# Patient Record
Sex: Female | Born: 1985
Health system: Southern US, Community
[De-identification: ages and names within clinical notes are randomized; demographics above are authoritative.]

## PROBLEM LIST (undated history)

## (undated) DIAGNOSIS — I10 Essential (primary) hypertension: Secondary | ICD-10-CM

## (undated) DIAGNOSIS — N62 Hypertrophy of breast: Secondary | ICD-10-CM

---
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
## 2005-08-10 ENCOUNTER — Emergency Department (HOSPITAL_COMMUNITY): Admission: EM | Admit: 2005-08-10 | Discharge: 2005-08-10 | Payer: Self-pay | Admitting: Family Medicine

## 2006-04-11 ENCOUNTER — Emergency Department (HOSPITAL_COMMUNITY): Admission: EM | Admit: 2006-04-11 | Discharge: 2006-04-11 | Payer: Self-pay | Admitting: Family Medicine

## 2006-08-06 ENCOUNTER — Emergency Department (HOSPITAL_COMMUNITY): Admission: EM | Admit: 2006-08-06 | Discharge: 2006-08-06 | Payer: Self-pay | Admitting: Emergency Medicine

## 2007-01-26 ENCOUNTER — Encounter (INDEPENDENT_AMBULATORY_CARE_PROVIDER_SITE_OTHER): Payer: Self-pay | Admitting: *Deleted

## 2007-03-20 ENCOUNTER — Emergency Department (HOSPITAL_COMMUNITY): Admission: EM | Admit: 2007-03-20 | Discharge: 2007-03-20 | Payer: Self-pay | Admitting: Family Medicine

## 2007-09-06 ENCOUNTER — Telehealth: Payer: Self-pay | Admitting: *Deleted

## 2007-09-07 ENCOUNTER — Encounter (INDEPENDENT_AMBULATORY_CARE_PROVIDER_SITE_OTHER): Payer: Self-pay | Admitting: Family Medicine

## 2007-09-07 ENCOUNTER — Ambulatory Visit: Payer: Self-pay | Admitting: Family Medicine

## 2007-09-07 LAB — CONVERTED CEMR LAB
Basophils Relative: 1 % (ref 0–1)
Eosinophils Relative: 2 % (ref 0–5)
Hemoglobin: 14.2 g/dL (ref 12.0–15.0)
Lymphs Abs: 1.6 10*3/uL (ref 0.7–3.3)
MCV: 85.4 fL (ref 78.0–100.0)
Monocytes Absolute: 0.4 10*3/uL (ref 0.2–0.7)
Monocytes Relative: 8 % (ref 3–11)
Neutro Abs: 3.6 10*3/uL (ref 1.7–7.7)
Neutrophils Relative %: 63 % (ref 43–77)
RBC: 5.06 M/uL (ref 3.87–5.11)
Rhuematoid fact SerPl-aCnc: <20 intl units/mL (ref 0–20)

## 2007-09-10 ENCOUNTER — Encounter: Admission: RE | Admit: 2007-09-10 | Discharge: 2007-09-10 | Payer: Self-pay | Admitting: Family Medicine

## 2007-10-08 ENCOUNTER — Ambulatory Visit: Payer: Self-pay | Admitting: Sports Medicine

## 2007-11-07 ENCOUNTER — Ambulatory Visit: Payer: Self-pay | Admitting: Family Medicine

## 2007-11-07 ENCOUNTER — Encounter (INDEPENDENT_AMBULATORY_CARE_PROVIDER_SITE_OTHER): Payer: Self-pay | Admitting: Family Medicine

## 2007-11-07 DIAGNOSIS — E669 Obesity, unspecified: Secondary | ICD-10-CM | POA: Insufficient documentation

## 2007-11-07 LAB — CONVERTED CEMR LAB
HDL: 66 mg/dL
LDL Cholesterol: 71 mg/dL

## 2007-11-20 ENCOUNTER — Encounter (INDEPENDENT_AMBULATORY_CARE_PROVIDER_SITE_OTHER): Payer: Self-pay | Admitting: Family Medicine

## 2008-01-16 ENCOUNTER — Telehealth (INDEPENDENT_AMBULATORY_CARE_PROVIDER_SITE_OTHER): Payer: Self-pay | Admitting: Family Medicine

## 2008-05-08 ENCOUNTER — Telehealth (INDEPENDENT_AMBULATORY_CARE_PROVIDER_SITE_OTHER): Payer: Self-pay | Admitting: Family Medicine

## 2008-12-18 ENCOUNTER — Emergency Department (HOSPITAL_COMMUNITY): Admission: EM | Admit: 2008-12-18 | Discharge: 2008-12-18 | Payer: Self-pay | Admitting: Family Medicine

## 2009-01-14 ENCOUNTER — Ambulatory Visit: Payer: Self-pay | Admitting: Family Medicine

## 2009-01-14 ENCOUNTER — Encounter (INDEPENDENT_AMBULATORY_CARE_PROVIDER_SITE_OTHER): Payer: Self-pay | Admitting: Family Medicine

## 2009-01-14 DIAGNOSIS — K219 Gastro-esophageal reflux disease without esophagitis: Secondary | ICD-10-CM | POA: Insufficient documentation

## 2009-01-14 LAB — CONVERTED CEMR LAB: GC Probe Amp, Genital: NEGATIVE

## 2009-01-19 ENCOUNTER — Encounter (INDEPENDENT_AMBULATORY_CARE_PROVIDER_SITE_OTHER): Payer: Self-pay | Admitting: Family Medicine

## 2009-01-19 ENCOUNTER — Ambulatory Visit (HOSPITAL_COMMUNITY): Admission: RE | Admit: 2009-01-19 | Discharge: 2009-01-19 | Payer: Self-pay | Admitting: Family Medicine

## 2009-01-22 ENCOUNTER — Encounter: Payer: Self-pay | Admitting: Family Medicine

## 2009-01-22 ENCOUNTER — Encounter (INDEPENDENT_AMBULATORY_CARE_PROVIDER_SITE_OTHER): Payer: Self-pay | Admitting: Family Medicine

## 2009-01-22 LAB — CONVERTED CEMR LAB
HCT: 37.7 %
MCV: 85.4 fL
RBC: 4.42 M/uL
RDW: 12.9 %
WBC: 7 10*3/uL

## 2009-08-06 ENCOUNTER — Ambulatory Visit (HOSPITAL_COMMUNITY): Admission: RE | Admit: 2009-08-06 | Discharge: 2009-08-06 | Payer: Self-pay | Admitting: Obstetrics and Gynecology

## 2009-09-01 ENCOUNTER — Inpatient Hospital Stay (HOSPITAL_COMMUNITY): Admission: AD | Admit: 2009-09-01 | Discharge: 2009-09-04 | Payer: Self-pay | Admitting: Family Medicine

## 2009-12-30 ENCOUNTER — Emergency Department (HOSPITAL_COMMUNITY): Admission: EM | Admit: 2009-12-30 | Discharge: 2009-12-30 | Payer: Self-pay | Admitting: Emergency Medicine

## 2010-09-21 LAB — CONVERTED CEMR LAB
HCT: 38.8 %
MCV: 84.2 fL
Total CHOL/HDL Ratio: 3.2

## 2010-10-08 ENCOUNTER — Ambulatory Visit: Payer: Self-pay | Admitting: Family Medicine

## 2010-10-08 ENCOUNTER — Encounter: Payer: Self-pay | Admitting: Family Medicine

## 2010-10-08 DIAGNOSIS — I10 Essential (primary) hypertension: Secondary | ICD-10-CM | POA: Insufficient documentation

## 2010-10-08 LAB — CONVERTED CEMR LAB
ALT: 12 units/L
Albumin: 4.3 g/dL
Alkaline Phosphatase: 58 units/L
CO2: 23 meq/L
Calcium: 9.3 mg/dL
Chloride: 104 meq/L
Creatinine, Ser: 0.6 mg/dL
Sodium: 139 meq/L

## 2010-10-15 ENCOUNTER — Encounter: Payer: Self-pay | Admitting: Family Medicine

## 2010-10-20 ENCOUNTER — Encounter: Payer: Self-pay | Admitting: Family Medicine

## 2010-10-22 LAB — CONVERTED CEMR LAB
HCT: 38.8 %
MCHC: 33 g/dL
Platelets: 275 10*3/uL
RDW: 14 %

## 2010-12-30 NOTE — Miscellaneous (Signed)
Summary: ROI  ROI   Imported By: De Nurse 10/15/2010 16:59:57  _____________________________________________________________________  External Attachment:    Type:   Image     Comment:   External Document

## 2010-12-30 NOTE — Assessment & Plan Note (Signed)
Summary: cpe/   Vital Signs:  Patient profile:   25 year old female Height:      64 inches Weight:      229.3 pounds BMI:     39.50 Pulse rate:   92 / minute BP sitting:   138 / 88  (right arm)  Vitals Entered By: Arlyss Repress CMA, (October 08, 2010 2:54 PM) CC: physical. discuss HTN and weight management. f/up labs. Is Patient Diabetic? No Pain Assessment Patient in pain? no        Primary Care Yogesh Cominsky:  Milinda Antis MD  CC:  physical. discuss HTN and weight management. f/up labs..  History of Present Illness:   Hypertension-- Blood pressure at work has been normal range taking less than 120/80, no chest pain,no HA , GYN has been managing BP will get records, taking Labetalol . States given diagnosis in first trimester  Weight loss--  Heaviest weight 240lbs, would like to loose weight , eats 3 times a day, no snacks 24hours recall- Breakfast Sausage/ Grits/ Cofee (sugar cream),   Lunch- Spaghetti meat sauce, gingerale,     Dinner- [employer] chicken, fries, honey mustard , Sweat Tea , drink 2 bottles (16oz) of water , Mother in law cooks for family   No exercise  Goal to is get under 200lbs --- Understands she needs to exercise, needs motivation, husband lacks motivation, lacks finances     Labs--  reviewed labs from [facility]   States had PAP smear this year at GYN  No birth control currently  Habits & Providers  Alcohol-Tobacco-Diet     Tobacco Status: never  Current Medications (verified): 1)  Ranitidine Hcl 150 Mg Caps (Ranitidine Hcl) .Marland Kitchen.. 1 By Mouth Two Times A Day 2)  Labetalol Hcl 100 Mg Tabs (Labetalol Hcl) .Marland Kitchen.. 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  Lives with Mom, daugher, and stepdad.  Denies tobacco and illicit drugs.  Occasional social drink. Married,  Sexually active, monogamous.  Physical Exam  General:  NAD, well developed, obese Vital signs noted Repeat BP 128/80 Eyes:  vision grossly intact, pupils equal, pupils round, pupils reactive to light,  Lungs:  Normal respiratory effort, chest expands symmetrically. Lungs are clear to auscultation, no crackles or wheezes. Heart:  RRR, no murmur Pulses:  Radial DP 2+ Extremities:  No edema   Impression & Recommendations:  Problem # 1:  HYPERTENSION, BENIGN (ICD-401.1) Assessment New   Newly diagnosed, as given diagnosis in first trimester likley essential HTN, currently not breastfeeding, will obtain records from GYN, pt can likley be switched to HCTZ, though BP well controlled  Her updated medication list for this problem includes:    Labetalol Hcl 100 Mg Tabs (Labetalol hcl) .Marland Kitchen... 1 by mouth two times a day  Orders: [facility]- Est  Level 4 (04540)  Problem # 2:  OBESITY (ICD-278.00) Assessment: Unchanged   s/p NSVD 1 year ago. Noted pt does not eat veggies or fruits routinely and has no exercise program. Pt wants to loose weight but lacks motivation.  See instructions below  Orders: [facility]- Est  Level 4 (98119) Discussed labs with patient Obtain records to verify last PAP  Smear  Complete Medication List: 1)  Ranitidine Hcl 150 Mg Caps (Ranitidine hcl) .Marland Kitchen.. 1 by mouth two times a day 2)  Labetalol Hcl 100 Mg Tabs (Labetalol hcl) .Marland Kitchen.. 1 by mouth two times a day  Patient Instructions: 1)  Consider joining a gym, or walking at home- 2 days a week 30 minutes 2)  Keep a calender on the fridge and check off the days 3)  Meals- try to add 1 veggie with lunch and dinner, get some fruit in 4)  Try to eat 1 snack-fruit ,granola bar 5)  Danie Chandler, nutritionist- schedule an appt if you would like 6)  Next visit in 2 months  7)  I will  records from your ob/gyn  8)  No change to your blood pressure medications    Orders Added: 1)  [facility]- Est  Level 4 [54098]   Immunization History:  Influenza Immunization History:    Influenza:  historical (09/21/2010)   Immunization History:  Influenza Immunization History:    Influenza:  Historical (09/21/2010)

## 2010-12-30 NOTE — Miscellaneous (Signed)
Summary: labs  Clinical Lists Changes  Observations: Added new observation of HIV AB: neg (01/22/2009 11:33) Added new observation of HBSAG: negatvie (01/22/2009 11:33) Added new observation of PLATELETK/UL: 232 K/uL (01/22/2009 11:33) Added new observation of RDW: 12.9 % (01/22/2009 11:33) Added new observation of MCHC RBC: 33.6 g/dL (16/08/9603 54:09) Added new observation of MCV: 85.4 fL (01/22/2009 11:33) Added new observation of HCT: 37.7 % (01/22/2009 11:33) Added new observation of HGB: 12.7 g/dL (81/19/1478 29:56) Added new observation of RBC M/UL: 4.42 M/uL (01/22/2009 11:33) Added new observation of WBC COUNT: 7.0 10*3/microliter (01/22/2009 11:33)

## 2011-01-12 ENCOUNTER — Inpatient Hospital Stay (INDEPENDENT_AMBULATORY_CARE_PROVIDER_SITE_OTHER)
Admission: RE | Admit: 2011-01-12 | Discharge: 2011-01-12 | Disposition: A | Discharge status: Home / Self Care | Payer: Commercial Managed Care - PPO | Source: Ambulatory Visit | Attending: Emergency Medicine | Admitting: Emergency Medicine

## 2011-01-12 DIAGNOSIS — K5289 Other specified noninfective gastroenteritis and colitis: Secondary | ICD-10-CM

## 2011-01-12 LAB — POCT I-STAT, CHEM 8
BUN: 7 mg/dL (ref 6–23)
Calcium, Ion: 1.1 mmol/L — ABNORMAL LOW (ref 1.12–1.32)
Chloride: 106 mEq/L (ref 96–112)
Creatinine, Ser: 0.8 mg/dL (ref 0.4–1.2)
Glucose, Bld: 90 mg/dL (ref 70–99)

## 2011-01-12 LAB — DIFFERENTIAL
Basophils Absolute: 0.1 10*3/uL (ref 0.0–0.1)
Basophils Relative: 1 % (ref 0–1)
Eosinophils Absolute: 0.1 10*3/uL (ref 0.0–0.7)
Lymphocytes Relative: 30 % (ref 12–46)
Lymphs Abs: 1.6 10*3/uL (ref 0.7–4.0)
Monocytes Absolute: 0.5 10*3/uL (ref 0.1–1.0)
Neutro Abs: 3.3 10*3/uL (ref 1.7–7.7)

## 2011-01-12 LAB — POCT URINALYSIS DIPSTICK
Hgb urine dipstick: NEGATIVE
Nitrite: NEGATIVE
Specific Gravity, Urine: 1.025 (ref 1.005–1.030)

## 2011-01-12 LAB — CBC
HCT: 38.2 % (ref 36.0–46.0)
Hemoglobin: 13 g/dL (ref 12.0–15.0)
MCV: 83 fL (ref 78.0–100.0)

## 2011-02-14 ENCOUNTER — Emergency Department (HOSPITAL_COMMUNITY): Payer: No Typology Code available for payment source

## 2011-02-14 ENCOUNTER — Emergency Department (HOSPITAL_COMMUNITY)
Admission: EM | Admit: 2011-02-14 | Discharge: 2011-02-14 | Disposition: A | Discharge status: Home / Self Care | Payer: No Typology Code available for payment source | Attending: General Surgery | Admitting: General Surgery

## 2011-02-14 DIAGNOSIS — IMO0002 Reserved for concepts with insufficient information to code with codable children: Secondary | ICD-10-CM | POA: Insufficient documentation

## 2011-02-14 DIAGNOSIS — S139XXA Sprain of joints and ligaments of unspecified parts of neck, initial encounter: Secondary | ICD-10-CM | POA: Insufficient documentation

## 2011-02-14 DIAGNOSIS — M542 Cervicalgia: Secondary | ICD-10-CM | POA: Insufficient documentation

## 2011-02-14 DIAGNOSIS — I10 Essential (primary) hypertension: Secondary | ICD-10-CM | POA: Insufficient documentation

## 2011-02-14 DIAGNOSIS — S060X0A Concussion without loss of consciousness, initial encounter: Secondary | ICD-10-CM | POA: Insufficient documentation

## 2011-02-14 DIAGNOSIS — R51 Headache: Secondary | ICD-10-CM | POA: Insufficient documentation

## 2011-02-17 ENCOUNTER — Ambulatory Visit
Admission: RE | Admit: 2011-02-17 | Discharge: 2011-02-17 | Disposition: A | Discharge status: Home / Self Care | Payer: Self-pay | Source: Ambulatory Visit | Attending: Family Medicine | Admitting: Family Medicine

## 2011-02-17 ENCOUNTER — Ambulatory Visit (INDEPENDENT_AMBULATORY_CARE_PROVIDER_SITE_OTHER): Payer: Commercial Managed Care - PPO | Admitting: Family Medicine

## 2011-02-17 ENCOUNTER — Encounter: Payer: Self-pay | Admitting: Family Medicine

## 2011-02-17 ENCOUNTER — Telehealth: Payer: Self-pay | Admitting: Family Medicine

## 2011-02-17 ENCOUNTER — Other Ambulatory Visit: Payer: Self-pay | Admitting: Family Medicine

## 2011-02-17 VITALS — BP 128/96 | HR 82 | Temp 98.4°F | Ht 65.0 in | Wt 235.0 lb

## 2011-02-17 DIAGNOSIS — R109 Unspecified abdominal pain: Secondary | ICD-10-CM

## 2011-02-17 DIAGNOSIS — S139XXA Sprain of joints and ligaments of unspecified parts of neck, initial encounter: Secondary | ICD-10-CM

## 2011-02-17 DIAGNOSIS — S39012A Strain of muscle, fascia and tendon of lower back, initial encounter: Secondary | ICD-10-CM | POA: Insufficient documentation

## 2011-02-17 DIAGNOSIS — S060XAA Concussion with loss of consciousness status unknown, initial encounter: Secondary | ICD-10-CM | POA: Insufficient documentation

## 2011-02-17 DIAGNOSIS — IMO0002 Reserved for concepts with insufficient information to code with codable children: Secondary | ICD-10-CM

## 2011-02-17 DIAGNOSIS — S335XXA Sprain of ligaments of lumbar spine, initial encounter: Secondary | ICD-10-CM

## 2011-02-17 DIAGNOSIS — S161XXA Strain of muscle, fascia and tendon at neck level, initial encounter: Secondary | ICD-10-CM | POA: Insufficient documentation

## 2011-02-17 DIAGNOSIS — S060X9A Concussion with loss of consciousness of unspecified duration, initial encounter: Secondary | ICD-10-CM

## 2011-02-17 MED ORDER — CYCLOBENZAPRINE HCL 10 MG PO TABS: 10.0000 mg | ORAL_TABLET | Freq: Three times a day (TID) | ORAL | Status: DC | PRN

## 2011-02-17 MED ORDER — OXYCODONE-ACETAMINOPHEN 5-325 MG PO TABS: ORAL_TABLET | ORAL | Status: DC

## 2011-02-17 NOTE — Telephone Encounter (Signed)
Completed will put in front desk
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
## 2011-02-17 NOTE — Patient Instructions (Signed)
Start the flexeril Use the Percocet as needed for pain, if you have difficulty with this medicine please let me know You should remain out of work until seen by me next week  If you have worsening of Headache, vomiting, severe pain change in your vision- then go to the ER Get the x-ray of your chest

## 2011-02-17 NOTE — Assessment & Plan Note (Addendum)
With bruising and pain noted on chest wall, will obtain rib series

## 2011-02-17 NOTE — Telephone Encounter (Signed)
FMLA form dropped off to be filled out.  Please call patient when completed. Placed in dr's box.

## 2011-02-17 NOTE — Assessment & Plan Note (Signed)
peristant mild concussive symptoms- Out of work, monitor Regarding blurred disc , pt was re-examined with attending physican- slight blurring but feel this may be a variant as the optic cup appears normal and the vasculature. If continued symptoms plan to rescan with CT Head
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
## 2011-02-17 NOTE — Assessment & Plan Note (Signed)
No red flags, treatment per above

## 2011-02-18 ENCOUNTER — Telehealth: Payer: Self-pay | Admitting: Family Medicine

## 2011-02-18 NOTE — Telephone Encounter (Signed)
Message given to patient about neg rib series She is doing okay, understands to call to speak to on call MD if she has difficulty with medication or go to ER for worsening symptoms

## 2011-02-28 ENCOUNTER — Ambulatory Visit
Admission: RE | Admit: 2011-02-28 | Discharge: 2011-02-28 | Disposition: A | Discharge status: Home / Self Care | Payer: Commercial Managed Care - PPO | Source: Ambulatory Visit | Attending: Family Medicine | Admitting: Family Medicine

## 2011-02-28 ENCOUNTER — Ambulatory Visit (INDEPENDENT_AMBULATORY_CARE_PROVIDER_SITE_OTHER): Payer: Commercial Managed Care - PPO | Admitting: Family Medicine

## 2011-02-28 ENCOUNTER — Other Ambulatory Visit: Payer: Self-pay | Admitting: Family Medicine

## 2011-02-28 VITALS — BP 120/79 | HR 75 | Temp 98.0°F | Ht 64.5 in | Wt 238.4 lb

## 2011-02-28 DIAGNOSIS — M549 Dorsalgia, unspecified: Secondary | ICD-10-CM

## 2011-02-28 DIAGNOSIS — M545 Low back pain, unspecified: Secondary | ICD-10-CM | POA: Insufficient documentation

## 2011-02-28 DIAGNOSIS — S139XXA Sprain of joints and ligaments of unspecified parts of neck, initial encounter: Secondary | ICD-10-CM

## 2011-02-28 DIAGNOSIS — S161XXA Strain of muscle, fascia and tendon at neck level, initial encounter: Secondary | ICD-10-CM

## 2011-02-28 DIAGNOSIS — IMO0002 Reserved for concepts with insufficient information to code with codable children: Secondary | ICD-10-CM

## 2011-02-28 DIAGNOSIS — G8929 Other chronic pain: Secondary | ICD-10-CM | POA: Insufficient documentation

## 2011-02-28 MED ORDER — OXYCODONE-ACETAMINOPHEN 5-325 MG PO TABS: ORAL_TABLET | ORAL | Status: DC

## 2011-02-28 NOTE — Assessment & Plan Note (Signed)
Diagnosed with lumbar strain, now with persistent pain with dominant side. Will obtain plain films of back, this may still be musculoskeletal Pt to start PT  No red flags

## 2011-02-28 NOTE — Patient Instructions (Signed)
Follow-up with Dr. Margaretha Sheffield on Alexis Foley We will get the x-rays of your back  Continue with the plan for PT Use the pain medication as needed
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
## 2011-02-28 NOTE — Assessment & Plan Note (Signed)
Neck ROM has improved a vast amount compared to initial exam, she still has some spasm in that left trapezius. Her neurological exam overall is reassuring, no gross weakness found on the left shoulder, will plan for her to start PT as set up by the orthopedic surgeon Hold on MRI as she is progressing in the right direction. Continue pain medications and muscle relaxant Out of work at least until the end of the week

## 2011-03-01 ENCOUNTER — Telehealth: Payer: Self-pay | Admitting: Family Medicine

## 2011-03-01 NOTE — Telephone Encounter (Signed)
X-ray of back normal, no evidence of herniation, disc buldge, arthritic changes, fracture Pt informed

## 2011-03-03 LAB — CBC
HCT: 29.6 % — ABNORMAL LOW (ref 36.0–46.0)
Hemoglobin: 12.8 g/dL (ref 12.0–15.0)
MCV: 90 fL (ref 78.0–100.0)
RBC: 3.29 MIL/uL — ABNORMAL LOW (ref 3.87–5.11)
RBC: 4.23 MIL/uL (ref 3.87–5.11)
WBC: 10.1 10*3/uL (ref 4.0–10.5)

## 2011-03-03 LAB — RPR: RPR Ser Ql: NONREACTIVE

## 2011-03-07 ENCOUNTER — Telehealth: Payer: Self-pay | Admitting: Family Medicine

## 2011-03-07 NOTE — Telephone Encounter (Signed)
Pt now back at work F/U with Charlane Ferretti- Dr. Margaretha Sheffield in 1 month PT starts tomorrow I informed her I have compelted her short term disability form

## 2011-03-08 ENCOUNTER — Ambulatory Visit: Payer: 59 | Attending: Sports Medicine | Admitting: Physical Therapy

## 2011-03-08 DIAGNOSIS — R293 Abnormal posture: Secondary | ICD-10-CM | POA: Insufficient documentation

## 2011-03-08 DIAGNOSIS — IMO0001 Reserved for inherently not codable concepts without codable children: Secondary | ICD-10-CM | POA: Insufficient documentation

## 2011-03-08 DIAGNOSIS — M2560 Stiffness of unspecified joint, not elsewhere classified: Secondary | ICD-10-CM | POA: Insufficient documentation

## 2011-03-08 DIAGNOSIS — M2550 Pain in unspecified joint: Secondary | ICD-10-CM | POA: Insufficient documentation

## 2011-03-14 LAB — POCT URINALYSIS DIP (DEVICE)
Bilirubin Urine: NEGATIVE
Ketones, ur: 40 mg/dL — AB
Protein, ur: NEGATIVE mg/dL
Specific Gravity, Urine: 1.02 (ref 1.005–1.030)
pH: 7 (ref 5.0–8.0)

## 2011-03-14 LAB — POCT PREGNANCY, URINE: Preg Test, Ur: POSITIVE

## 2011-03-16 ENCOUNTER — Ambulatory Visit: Payer: 59 | Admitting: Physical Therapy

## 2011-03-17 ENCOUNTER — Ambulatory Visit: Payer: 59 | Admitting: Physical Therapy

## 2011-03-23 ENCOUNTER — Ambulatory Visit: Payer: 59 | Admitting: Physical Therapy

## 2011-03-25 ENCOUNTER — Ambulatory Visit: Payer: 59 | Admitting: Physical Therapy

## 2011-03-29 ENCOUNTER — Ambulatory Visit: Payer: 59 | Attending: Sports Medicine | Admitting: Physical Therapy

## 2011-03-29 ENCOUNTER — Encounter: Payer: Commercial Managed Care - PPO | Admitting: Physical Therapy

## 2011-03-29 DIAGNOSIS — M2560 Stiffness of unspecified joint, not elsewhere classified: Secondary | ICD-10-CM | POA: Insufficient documentation

## 2011-03-29 DIAGNOSIS — IMO0001 Reserved for inherently not codable concepts without codable children: Secondary | ICD-10-CM | POA: Insufficient documentation

## 2011-03-29 DIAGNOSIS — M2550 Pain in unspecified joint: Secondary | ICD-10-CM | POA: Insufficient documentation

## 2011-03-29 DIAGNOSIS — R293 Abnormal posture: Secondary | ICD-10-CM | POA: Insufficient documentation

## 2011-03-31 ENCOUNTER — Ambulatory Visit: Payer: 59 | Admitting: Physical Therapy

## 2011-04-06 ENCOUNTER — Other Ambulatory Visit: Payer: Self-pay | Admitting: Family Medicine

## 2011-04-06 ENCOUNTER — Ambulatory Visit: Payer: 59 | Admitting: Physical Therapy

## 2011-04-06 MED ORDER — LABETALOL HCL 100 MG PO TABS: 100.0000 mg | ORAL_TABLET | Freq: Two times a day (BID) | ORAL | Status: AC

## 2011-04-06 NOTE — Telephone Encounter (Signed)
Ran out of labetalol (NORMODYNE) 100 MG tablet which was prescribed by another doctor - wants to know if Dr Jeanice Lim will continue this refill- [facility]

## 2011-04-06 NOTE — Telephone Encounter (Signed)
Pt has diagnosis of HTN, will fill her Labetalol

## 2011-05-31 ENCOUNTER — Ambulatory Visit: Payer: Commercial Managed Care - PPO | Admitting: Family Medicine

## 2011-06-25 ENCOUNTER — Encounter: Payer: Self-pay | Admitting: Family Medicine

## 2011-12-30 DIAGNOSIS — N62 Hypertrophy of breast: Secondary | ICD-10-CM

## 2011-12-30 HISTORY — DX: Hypertrophy of breast: N62

## 2012-01-10 ENCOUNTER — Encounter (HOSPITAL_BASED_OUTPATIENT_CLINIC_OR_DEPARTMENT_OTHER): Payer: Self-pay | Admitting: *Deleted

## 2012-01-10 NOTE — Pre-Procedure Instructions (Signed)
To come for BMET; last EKG 01/2011

## 2012-01-16 ENCOUNTER — Encounter (HOSPITAL_BASED_OUTPATIENT_CLINIC_OR_DEPARTMENT_OTHER)
Admission: RE | Admit: 2012-01-16 | Discharge: 2012-01-16 | Disposition: A | Discharge status: Home / Self Care | Payer: 59 | Source: Ambulatory Visit | Attending: Plastic Surgery | Admitting: Plastic Surgery

## 2012-01-16 LAB — BASIC METABOLIC PANEL
CO2: 23 mEq/L (ref 19–32)
Calcium: 10 mg/dL (ref 8.4–10.5)
Chloride: 104 mEq/L (ref 96–112)
Glucose, Bld: 91 mg/dL (ref 70–99)
Sodium: 137 mEq/L (ref 135–145)

## 2012-01-17 ENCOUNTER — Encounter (HOSPITAL_BASED_OUTPATIENT_CLINIC_OR_DEPARTMENT_OTHER): Payer: Self-pay | Admitting: Certified Registered Nurse Anesthetist

## 2012-01-17 ENCOUNTER — Encounter (HOSPITAL_BASED_OUTPATIENT_CLINIC_OR_DEPARTMENT_OTHER): Payer: Self-pay | Admitting: *Deleted

## 2012-01-17 ENCOUNTER — Encounter (HOSPITAL_BASED_OUTPATIENT_CLINIC_OR_DEPARTMENT_OTHER)
Admission: RE | Disposition: A | Discharge status: Home / Self Care | Payer: Self-pay | Source: Ambulatory Visit | Attending: Plastic Surgery

## 2012-01-17 ENCOUNTER — Other Ambulatory Visit: Payer: Self-pay | Admitting: Plastic Surgery

## 2012-01-17 ENCOUNTER — Ambulatory Visit (HOSPITAL_BASED_OUTPATIENT_CLINIC_OR_DEPARTMENT_OTHER)
Admission: RE | Admit: 2012-01-17 | Discharge: 2012-01-17 | Disposition: A | Discharge status: Home / Self Care | Payer: 59 | Source: Ambulatory Visit | Attending: Plastic Surgery | Admitting: Plastic Surgery

## 2012-01-17 ENCOUNTER — Ambulatory Visit (HOSPITAL_BASED_OUTPATIENT_CLINIC_OR_DEPARTMENT_OTHER): Payer: 59 | Admitting: Certified Registered Nurse Anesthetist

## 2012-01-17 DIAGNOSIS — I10 Essential (primary) hypertension: Secondary | ICD-10-CM | POA: Insufficient documentation

## 2012-01-17 DIAGNOSIS — Z01812 Encounter for preprocedural laboratory examination: Secondary | ICD-10-CM | POA: Insufficient documentation

## 2012-01-17 DIAGNOSIS — K219 Gastro-esophageal reflux disease without esophagitis: Secondary | ICD-10-CM | POA: Insufficient documentation

## 2012-01-17 DIAGNOSIS — N62 Hypertrophy of breast: Secondary | ICD-10-CM | POA: Insufficient documentation

## 2012-01-17 HISTORY — PX: BREAST REDUCTION SURGERY: SHX8

## 2012-01-17 HISTORY — DX: Essential (primary) hypertension: I10

## 2012-01-17 HISTORY — DX: Hypertrophy of breast: N62

## 2012-01-17 SURGERY — MAMMOPLASTY, REDUCTION
Anesthesia: General | Site: Breast | Laterality: Bilateral | Wound class: Clean

## 2012-01-17 MED ORDER — PROPOFOL 10 MG/ML IV EMUL
INTRAVENOUS | Status: DC | PRN
  Administered 2012-01-17: 200 mg via INTRAVENOUS

## 2012-01-17 MED ORDER — BUPIVACAINE-EPINEPHRINE 0.5% -1:200000 IJ SOLN
INTRAMUSCULAR | Status: DC | PRN
  Administered 2012-01-17: 40 mL

## 2012-01-17 MED ORDER — MIDAZOLAM HCL 5 MG/5ML IJ SOLN
INTRAMUSCULAR | Status: DC | PRN
  Administered 2012-01-17: 2 mg via INTRAVENOUS

## 2012-01-17 MED ORDER — DROPERIDOL 2.5 MG/ML IJ SOLN: 0.6250 mg | INTRAMUSCULAR | Status: DC | PRN

## 2012-01-17 MED ORDER — CEFAZOLIN SODIUM 1-5 GM-% IV SOLN: 1.0000 g | Freq: Once | INTRAVENOUS | Status: DC

## 2012-01-17 MED ORDER — OXYCODONE-ACETAMINOPHEN 5-325 MG PO TABS: 1.0000 | ORAL_TABLET | ORAL | Status: DC | PRN

## 2012-01-17 MED ORDER — HYDROMORPHONE HCL PF 1 MG/ML IJ SOLN: 0.2500 mg | INTRAMUSCULAR | Status: DC | PRN

## 2012-01-17 MED ORDER — LIDOCAINE-EPINEPHRINE 1 %-1:100000 IJ SOLN
INTRAMUSCULAR | Status: DC | PRN
  Administered 2012-01-17: 40 mL

## 2012-01-17 MED ORDER — FENTANYL CITRATE 0.05 MG/ML IJ SOLN
INTRAMUSCULAR | Status: DC | PRN
  Administered 2012-01-17: 100 ug via INTRAVENOUS
  Administered 2012-01-17 (×3): 50 ug via INTRAVENOUS
  Administered 2012-01-17 (×2): 100 ug via INTRAVENOUS

## 2012-01-17 MED ORDER — SUCCINYLCHOLINE CHLORIDE 20 MG/ML IJ SOLN
INTRAMUSCULAR | Status: DC | PRN
  Administered 2012-01-17: 100 mg via INTRAVENOUS

## 2012-01-17 MED ORDER — CEFAZOLIN SODIUM 1-5 GM-% IV SOLN
INTRAVENOUS | Status: DC | PRN
  Administered 2012-01-17: 2 g via INTRAVENOUS

## 2012-01-17 MED ORDER — BACITRACIN ZINC 500 UNIT/GM EX OINT
TOPICAL_OINTMENT | CUTANEOUS | Status: DC | PRN
  Administered 2012-01-17: 1 via TOPICAL

## 2012-01-17 MED ORDER — LACTATED RINGERS IV SOLN
INTRAVENOUS | Status: DC
  Administered 2012-01-17 (×3): via INTRAVENOUS

## 2012-01-17 MED ORDER — DEXAMETHASONE SODIUM PHOSPHATE 4 MG/ML IJ SOLN
INTRAMUSCULAR | Status: DC | PRN
  Administered 2012-01-17: 10 mg via INTRAVENOUS

## 2012-01-17 MED ORDER — ONDANSETRON HCL 4 MG/2ML IJ SOLN
INTRAMUSCULAR | Status: DC | PRN
  Administered 2012-01-17: 4 mg via INTRAVENOUS

## 2012-01-17 SURGICAL SUPPLY — 57 items
BANDAGE GAUZE ELAST BULKY 4 IN (GAUZE/BANDAGES/DRESSINGS) ×4 IMPLANT
BENZOIN TINCTURE PRP APPL 2/3 (GAUZE/BANDAGES/DRESSINGS) ×2 IMPLANT
BLADE KNIFE PERSONA 10 (BLADE) ×8 IMPLANT
BLADE KNIFE PERSONA 15 (BLADE) ×6 IMPLANT
CANISTER SUCTION 1200CC (MISCELLANEOUS) ×2 IMPLANT
CAP BOUFFANT 24 BLUE NURSES (PROTECTIVE WEAR) ×2 IMPLANT
CLOTH BEACON ORANGE TIMEOUT ST (SAFETY) ×2 IMPLANT
COVER MAYO STAND STRL (DRAPES) ×2 IMPLANT
COVER TABLE BACK 60X90 (DRAPES) ×2 IMPLANT
DECANTER SPIKE VIAL GLASS SM (MISCELLANEOUS) ×4 IMPLANT
DRAIN CHANNEL 10F 3/8 F FF (DRAIN) ×4 IMPLANT
DRAPE LAPAROSCOPIC ABDOMINAL (DRAPES) ×2 IMPLANT
DRSG EMULSION OIL 3X3 NADH (GAUZE/BANDAGES/DRESSINGS) ×2 IMPLANT
DRSG PAD ABDOMINAL 8X10 ST (GAUZE/BANDAGES/DRESSINGS) ×2 IMPLANT
ELECT NEEDLE TIP 2.8 STRL (NEEDLE) IMPLANT
ELECT REM PT RETURN 9FT ADLT (ELECTROSURGICAL) ×2
ELECTRODE REM PT RTRN 9FT ADLT (ELECTROSURGICAL) ×1 IMPLANT
EVACUATOR SILICONE 100CC (DRAIN) ×4 IMPLANT
FILTER 7/8 IN (FILTER) ×2 IMPLANT
GLOVE BIO SURGEON STRL SZ 6.5 (GLOVE) ×6 IMPLANT
GLOVE BIOGEL PI IND STRL 6.5 (GLOVE) IMPLANT
GLOVE BIOGEL PI INDICATOR 6.5 (GLOVE)
GLOVE ECLIPSE 6.5 STRL STRAW (GLOVE) ×6 IMPLANT
GOWN PREVENTION PLUS XLARGE (GOWN DISPOSABLE) ×8 IMPLANT
GOWN PREVENTION PLUS XXLARGE (GOWN DISPOSABLE) IMPLANT
NEEDLE HYPO 25X1 1.5 SAFETY (NEEDLE) ×2 IMPLANT
NEEDLE SPNL 18GX3.5 QUINCKE PK (NEEDLE) ×2 IMPLANT
NS IRRIG 1000ML POUR BTL (IV SOLUTION) ×4 IMPLANT
PACK BASIN DAY SURGERY FS (CUSTOM PROCEDURE TRAY) ×2 IMPLANT
PIN SAFETY STERILE (MISCELLANEOUS) ×2 IMPLANT
SCRUB PCMX 4 OZ (MISCELLANEOUS) ×2 IMPLANT
SCRUB TECHNI CARE SURGICAL (MISCELLANEOUS) IMPLANT
SLEEVE SCD COMPRESS KNEE MED (MISCELLANEOUS) ×2 IMPLANT
SPECIMEN JAR MEDIUM (MISCELLANEOUS) IMPLANT
SPECIMEN JAR X LARGE (MISCELLANEOUS) ×4 IMPLANT
SPONGE GAUZE 4X4 12PLY (GAUZE/BANDAGES/DRESSINGS) ×2 IMPLANT
SPONGE LAP 18X18 X RAY DECT (DISPOSABLE) ×6 IMPLANT
STAPLER VISISTAT 35W (STAPLE) ×4 IMPLANT
STRIP CLOSURE SKIN 1/2X4 (GAUZE/BANDAGES/DRESSINGS) ×2 IMPLANT
SUT ETHILON 3 0 PS 1 (SUTURE) ×2 IMPLANT
SUT MNCRL AB 3-0 PS2 18 (SUTURE) ×8 IMPLANT
SUT MNCRL AB 4-0 PS2 18 (SUTURE) ×4 IMPLANT
SUT MON AB 5-0 PS2 18 (SUTURE) ×4 IMPLANT
SUT PROLENE 2 0 CT2 30 (SUTURE) ×2 IMPLANT
SUT PROLENE 3 0 PS 1 (SUTURE) ×4 IMPLANT
SUT QUILL PDO 2-0 (SUTURE) ×4 IMPLANT
SYR BULB IRRIGATION 50ML (SYRINGE) ×4 IMPLANT
SYR CONTROL 10ML LL (SYRINGE) ×4 IMPLANT
TOWEL OR 17X24 6PK STRL BLUE (TOWEL DISPOSABLE) ×6 IMPLANT
TOWEL OR NON WOVEN STRL DISP B (DISPOSABLE) ×2 IMPLANT
TRAY DSU PREP LF (CUSTOM PROCEDURE TRAY) ×2 IMPLANT
TRAY FOLEY CATH 14FR (SET/KITS/TRAYS/PACK) ×2 IMPLANT
TUBE CONNECTING 20X1/4 (TUBING) ×2 IMPLANT
UNDERPAD 30X30 INCONTINENT (UNDERPADS AND DIAPERS) ×4 IMPLANT
VAC PENCILS W/TUBING CLEAR (MISCELLANEOUS) ×2 IMPLANT
WATER STERILE IRR 1000ML POUR (IV SOLUTION) IMPLANT
YANKAUER SUCT BULB TIP NO VENT (SUCTIONS) ×2 IMPLANT

## 2012-01-17 NOTE — Discharge Instructions (Signed)
1. No lifting greater than 5 lbs. With arms for 4 weeks. 2. Empty, strip, record and reactivate JP drains three times a day. 3. Percocet 5/325 tabs, one to two tabs po q 4-6 hrs as needed for pain- prescription given in office. 4. Duricef 500 mg tabs one po q 12 hrs for one week- prescription given in office. 5. Sterapred dose pack as directed- prescription given in office. 6. Follow-up appointment Friday in office.    [facility]  [address] [place], [place] [address] [phone]   Post Anesthesia Home Care Instructions  Activity: Get plenty of rest for the remainder of the day. A responsible adult should stay with you for 24 hours following the procedure.  For the next 24 hours, DO NOT: -Drive a car -Advertising copywriter -Drink alcoholic beverages -Take any medication unless instructed by your physician -Make any legal decisions or sign important papers.  Meals: Start with liquid foods such as gelatin or soup. Progress to regular foods as tolerated. Avoid greasy, spicy, heavy foods. If nausea and/or vomiting occur, drink only clear liquids until the nausea and/or vomiting subsides. Call your physician if vomiting continues.  Special Instructions/Symptoms: Your throat may feel dry or sore from the anesthesia or the breathing tube placed in your throat during surgery. If this causes discomfort, gargle with warm salt water. The discomfort should disappear within 24 hours.    About my Jackson-Pratt Bulb Drain  What is a Jackson-Pratt bulb? A Jackson-Pratt is a soft, round device used to collect drainage. It is connected to a long, thin drainage catheter, which is held in place by one or two small stiches near your surgical incision site. When the bulb is squeezed, it forms a vacuum, forcing the drainage to empty into the bulb.  Emptying the Jackson-Pratt bulb- To empty the bulb: 1. Release the plug on the top of the bulb. 2. Pour the bulb's contents into a  measuring container which your nurse will provide. 3. Record the time emptied and amount of drainage. Empty the drain(s) as often as your     doctor or nurse recommends.  Date                  Time                    Amount (Drain 1)                 Amount (Drain 2)  _____________________________________________________________________  _____________________________________________________________________  _____________________________________________________________________  _____________________________________________________________________  _____________________________________________________________________  _____________________________________________________________________  _____________________________________________________________________  _____________________________________________________________________  Squeezing the Jackson-Pratt Bulb- To squeeze the bulb: 1. Make sure the plug at the top of the bulb is open. 2. Squeeze the bulb tightly in your fist. You will hear air squeezing from the bulb. 3. Replace the plug while the bulb is squeezed. 4. Use a safety pin to attach the bulb to your clothing. This will keep the catheter from     pulling at the bulb insertion site.  When to call your doctor- Call your doctor if:  Drain site becomes red, swollen or hot.  You have a fever greater than 101 degrees F.  There is oozing at the drain site.  Drain falls out (apply a guaze bandage over the drain hole and secure it with tape).  Drainage increases daily not related to activity patterns. (You will usually have more drainage when you are active than when you are resting.)  Drainage has a bad odor.

## 2012-01-17 NOTE — Brief Op Note (Signed)
01/17/2012  12:16 PM  PATIENT:  Dorothyann Peng  26 y.o. female  PRE-OPERATIVE DIAGNOSIS:  macromastia  POST-OPERATIVE DIAGNOSIS:  macromastia  PROCEDURE:  Procedure(s) (LRB): MAMMARY REDUCTION BILATERAL (BREAST) (Bilateral)  SURGEON:  Surgeon(s) and Role:    * Advaith Lamarque A Romelle Reiley, MD - Primary  ASSISTANTS: None  ANESTHESIA:   general  EBL:  Total I/O In: 2500 [I.V.:2500] Out: 585 [Urine:410; Blood:175]  BLOOD ADMINISTERED:none  DRAINS: (60F) Jackson-Pratt drain(s) with closed bulb suction in the Breast   LOCAL MEDICATIONS USED:  MARCAINE     SPECIMEN:  Source of Specimen:  Breast  DISPOSITION OF SPECIMEN:  PATHOLOGY  COUNTS:  Yes  DICTATION: .Other Dictation: Dictation Number [id number]  PLAN OF CARE: Discharge to home after PACU  PATIENT DISPOSITION:  PACU - hemodynamically stable.   Delay start of Pharmacological VTE agent (>24hrs) due to surgical blood loss or risk of bleeding: not applicable

## 2012-01-17 NOTE — Transfer of Care (Signed)
Immediate Anesthesia Transfer of Care Note  Patient: Alexis Foley  Procedure(s) Performed: Procedure(s) (LRB): MAMMARY REDUCTION BILATERAL (BREAST) (Bilateral)  Patient Location: PACU  Anesthesia Type: General  Level of Consciousness: awake, alert , oriented and patient cooperative  Airway & Oxygen Therapy: Patient Spontanous Breathing and Patient connected to face mask oxygen  Post-op Assessment: Report given to PACU RN and Post -op Vital signs reviewed and stable  Post vital signs: Reviewed and stable  Complications: No apparent anesthesia complications

## 2012-01-17 NOTE — Anesthesia Procedure Notes (Signed)
Procedure Name: Intubation Date/Time: 01/17/2012 8:03 AM Performed by: Rafael Quesada D Pre-anesthesia Checklist: Patient identified, Emergency Drugs available, Suction available and Patient being monitored Patient Re-evaluated:Patient Re-evaluated prior to inductionOxygen Delivery Method: Circle System Utilized Preoxygenation: Pre-oxygenation with 100% oxygen Intubation Type: IV induction Ventilation: Mask ventilation without difficulty Laryngoscope Size: 3 and Mac Grade View: Grade I Tube type: Oral Tube size: 7.0 mm Number of attempts: 1 Airway Equipment and Method: stylet and oral airway Placement Confirmation: ETT inserted through vocal cords under direct vision,  positive ETCO2 and breath sounds checked- equal and bilateral Tube secured with: Tape Dental Injury: Teeth and Oropharynx as per pre-operative assessment

## 2012-01-17 NOTE — Anesthesia Postprocedure Evaluation (Signed)
Anesthesia Post Note  Patient: Alexis Foley  Procedure(s) Performed: Procedure(s) (LRB): MAMMARY REDUCTION BILATERAL (BREAST) (Bilateral)  Anesthesia type: general  Patient location: PACU  Post pain: Pain level controlled  Post assessment: Patient's Cardiovascular Status Stable  Last Vitals:  Filed Vitals:   01/17/12 1233  BP: 148/81  Pulse: 97  Temp:   Resp: 12    Post vital signs: Reviewed and stable  Level of consciousness: sedated  Complications: No apparent anesthesia complications

## 2012-01-17 NOTE — Anesthesia Preprocedure Evaluation (Signed)
Anesthesia Evaluation  Patient identified by MRN, date of birth, ID band Patient awake    Reviewed: Allergy & Precautions, H&P , NPO status , Patient's Chart, lab work & pertinent test results, reviewed documented beta blocker date and time   Airway Mallampati: II TM Distance: >3 FB Neck ROM: full    Dental No notable dental hx. (+) Teeth Intact   Pulmonary neg pulmonary ROS,  clear to auscultation  Pulmonary exam normal       Cardiovascular hypertension, On Home Beta Blockers and On Medications regular - Systolic murmurs    Neuro/Psych    GI/Hepatic Neg liver ROS, GERD-  Medicated,  Endo/Other  Negative Endocrine ROS  Renal/GU negative Renal ROS     Musculoskeletal   Abdominal Normal abdominal exam  (+)   Peds  Hematology negative hematology ROS (+)   Anesthesia Other Findings   Reproductive/Obstetrics                           Anesthesia Physical Anesthesia Plan  ASA: II  Anesthesia Plan:    Post-op Pain Management:    Induction:   Airway Management Planned:   Additional Equipment:   Intra-op Plan:   Post-operative Plan:   Informed Consent: I have reviewed the patients History and Physical, chart, labs and discussed the procedure including the risks, benefits and alternatives for the proposed anesthesia with the patient or authorized representative who has indicated his/her understanding and acceptance.   Dental Advisory Given  Plan Discussed with: Anesthesiologist, CRNA and Surgeon  Anesthesia Plan Comments:         Anesthesia Quick Evaluation

## 2012-01-18 ENCOUNTER — Encounter (HOSPITAL_BASED_OUTPATIENT_CLINIC_OR_DEPARTMENT_OTHER): Payer: Self-pay | Admitting: Plastic Surgery

## 2012-03-29 NOTE — Op Note (Signed)
NAMEDAFNEY, FARLER NO.:  [id number]  MEDICAL RECORD NO.:  [id number]  LOCATION:                                 FACILITY:  PHYSICIAN:  Brantley Persons, M.D.DATE OF BIRTH:  1986-04-14  DATE OF PROCEDURE:  01/17/2012 DATE OF DISCHARGE:                              OPERATIVE REPORT   PREOPERATIVE DIAGNOSIS:  Bilateral macromastia.  POSTOPERATIVE DIAGNOSIS:  Bilateral macromastia.  PROCEDURE:  Bilateral reduction mammoplasties.  ATTENDING SURGEON:  Brantley Persons, MD  ANESTHESIA:  General.  ESTIMATED BLOOD LOSS:  150 mL.  COMPLICATIONS:  None.  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  She presents to undergo bilateral reduction mammoplasties.  Both of the breast reductions were performed in the following similar manner.  The nipple-areolar complex was marked with a 45-mm nipple marker.  This was then incised and deepithelialized around the nipple- areolar complex down to the inframammary crease in inferior pedicle pattern.  Next, the medial, superior, and lateral skin flaps were elevated down to the chest wall.  The excess fat and glandular tissue was removed from the inferior pedicle.  The wound was irrigated with saline irrigation.  Meticulous hemostasis was obtained with the Bovie electrocautery.  A 0.5% Marcaine with epinephrine was injected into the pectoralis major muscle.  The inferior pedicle was then centralized using 3-0 Prolene suture.  A #10 JP flat fully fluted drain was placed into the wound.  The skin flaps were brought together at the inverted T junction with a 2-0 Prolene suture.  The incisions were then stapled for temporary closure.  The breasts were compared and found to have good shape and symmetry.  The staples were then removed and the incisions were closed using 3-0 Monocryl to loosely tack together the dermal  layer from the medial aspect of the drain to the medial aspect of the IMC incision and then both the cuticular and dermal layers were closed in a single layer using a 2-0 Quill PDO suture.  The lateral to the JP drain incision was closed using 3-0 Monocryl in the dermal layer, followed by 3-0 Monocryl running intracuticular stitch on the skin.  The vertical limb of the Wise pattern was also closed in the dermal layer using 3-0 Monocryl suture.  The patient was placed in the upright position.  The future location of the nipple-areolar complexes was marked on both breast mounds with a 45- mm nipple marker.  The patient was then placed back in the recumbent position.  Both of the nipple-areolar complexes were brought out onto the breast mounds in the following similar manner.  The skin was incised as marked and removed full thickness into the subcutaneous tissues.  The nipple- areolar complex was examined, found to be pink and viable, and then brought out through this aperture and sewn in place using 4-0 Monocryl in the dermal layer, followed by 5-0 Monocryl running intracuticular stitch on the skin.  The 5-0 Monocryl suture was then carried down in continuity to close the cuticular layer of the vertical limb as well.  The bases of the breasts were then infiltrated with  0.5% Marcaine with epinephrine to provide a postsurgical anesthetic block.  The incisions were dressed with benzoin and Steri-Strips, and the nipples additionally with bacitracin ointment and Adaptic.  It should be noted that prior to closure of the breast wounds, a #10 JP flat fully fluted drain had been inserted.  This JP drain was secured to the skin using 3-0 nylon suture.  The 4x4s were then placed over the incisions, and the patient was placed in a light postoperative support bra.  There were no complications.  The patient tolerated the procedure well.  The final needle and sponge counts were reported to be correct at  the end of the case.  The patient was then recovered without complications.  Both the patient and her family were given proper postoperative wound care instructions including care of the JP drains.  She was then discharged home in the care of her family in stable condition.  Followup appointment will be on Friday in the office.          ______________________________ Brantley Persons, M.D.     MC/MEDQ  D:  03/28/2012  T:  03/28/2012  Job:  [id number]

## 2012-06-07 NOTE — Op Note (Signed)
NAMEBRIGHTEN, BUZZELLI NO.:  [id number]  MEDICAL RECORD NO.:  [id number]  LOCATION:                                 FACILITY:  PHYSICIAN:  Brantley Persons, M.D.DATE OF BIRTH:  18-Jan-1986  DATE OF PROCEDURE:  01/17/2012 DATE OF DISCHARGE:                              OPERATIVE REPORT   PREOPERATIVE DIAGNOSIS:  Bilateral macromastia.  POSTOPERATIVE DIAGNOSIS:  Bilateral macromastia.  PROCEDURE:  Bilateral reduction mammoplasties.  ATTENDING SURGEON:  Brantley Persons, MD  ANESTHESIA:  General.  ESTIMATED BLOOD LOSS:  150 mL.  COMPLICATIONS:  None.  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  She presents to undergo bilateral reduction mammoplasties.  DESCRIPTION OF PROCEDURE:  The patient was marked in the preop holding area in the pattern of Wise for the future bilateral reduction mammoplasties.  She was then taken back to the OR and placed on the table in supine position.  After adequate general anesthesia was obtained, the patient's chest was prepped with Techni-Care and draped in sterile fashion.  The bases of the breast had been injected with 1% lidocaine with epinephrine.  After adequate hemostasis and anesthesia had taken effect, the procedure was begun.  Both of the breast reductions were performed in the following similar manner.  The nipple-areolar complexes were marked with the 45 mm nipple marker.  The skin was then incised and deepithelialized around the nipple-areolar complex down to the inframammary crease in inferior pedicle pattern.  Next, the medial, superior, and lateral skin flaps were elevated down to the chest wall.  The excess fat and glandular tissue removed from the inferior pedicle.  The nipple-areolar complex was examined and found to be pink and viable.  The wound was irrigated with saline irrigation.  Meticulous hemostasis  was obtained with the Bovie electrocautery.  The inferior pedicle was then centralized using 3- 0 Prolene suture.  A #10 JP flat fully fluted drain was placed into the wound.  The skin flaps brought together at the inverted T junction with a 2-0 Prolene suture.  The incisions were stapled for temporary closure. The breasts were compared and found to have good shape and symmetry. The incisions were then closed from the medial aspect of the JP drain to the medial aspect of the [facility] incision by first placing a few 3-0 Monocryl sutures in the dermal layer, and then both the dermal and cuticular layers were closed in a single layer using a 2-0 Quill PDO barbed suture.  Lateral to the JP drain, incision was closed using 3-0 Monocryl in the dermal layer, followed by 3-0 Monocryl running intracuticular stitch on the skin.  The patient was placed in the upright position.  The future location of the nipple-areolar complexes was marked on both breast mounds using the 45 mm nipple marker.  She was then placed back in the recumbent position.  Both of the nipple-areolar complexes were brought out onto the breast mound in the following similar manner.  The skin was incised as marked and removed full thickness into the subcutaneous tissues.  The nipple- areolar complex was examined, found to be pink  and viable, then brought out through this aperture and sewn in place using 4-0 Monocryl in the dermal layer, followed by a 5-0 Monocryl running intracuticular stitch on the skin.  The vertical limb of the Wise pattern had been closed with 3-0 Monocryl in the dermal layer and now the 5-0 Monocryl suture was brought down in continuity from the nipple-areolar closure to close the cuticular layer of the vertical incision.  The drain was sewn in place using 3-0 nylon suture.  The pectoralis major muscle had been injected with 0.5% Marcaine with epinephrine and now the bases of the breasts and skin and soft  tissue along the incisions were also infiltrated with 0.5% Marcaine with epinephrine to provide a postsurgical anesthetic block. The incisions were dressed with benzoin, Steri-Strips, and the nipples additionally with bacitracin ointment and Adaptic, 4 x 4's were placed over the incisions and ABD pads in the axillary areas.  The patient was then placed into light postoperative support bra.  There were no complications.  The patient tolerated the procedure well.  The final needle and sponge counts were reported to be correct at the end of the case.  The patient was then extubated and taken to the recovery room in stable condition.  She was also recovered without complications.  Both the patient and her family were given proper postoperative wound care instructions including care of the JP drains.  She was then discharged home in the care of her family in stable condition.  Followup appointment will be within a few days in the office.          ______________________________ Brantley Persons, M.D.     MC/MEDQ  D:  06/06/2012  T:  06/06/2012  Job:  [id number]

## 2012-12-19 ENCOUNTER — Other Ambulatory Visit: Payer: Self-pay | Admitting: Radiology

## 2014-05-07 ENCOUNTER — Ambulatory Visit (INDEPENDENT_AMBULATORY_CARE_PROVIDER_SITE_OTHER): Payer: 59 | Admitting: Surgery

## 2014-07-14 ENCOUNTER — Other Ambulatory Visit: Payer: Self-pay | Admitting: Radiology

## 2014-07-25 ENCOUNTER — Encounter: Payer: 59 | Attending: Family Medicine | Admitting: Dietician

## 2014-07-25 DIAGNOSIS — E669 Obesity, unspecified: Secondary | ICD-10-CM | POA: Diagnosis not present

## 2014-07-25 DIAGNOSIS — Z6839 Body mass index (BMI) 39.0-39.9, adult: Secondary | ICD-10-CM | POA: Insufficient documentation

## 2014-07-25 DIAGNOSIS — Z713 Dietary counseling and surveillance: Secondary | ICD-10-CM | POA: Insufficient documentation

## 2014-07-25 NOTE — Progress Notes (Signed)
  Medical Nutrition Therapy:  Appt start time: 1045 end time:  1130.   Assessment:  Primary concerns today: Alexis Foley is here today for her [employer] badge.  She has been looking into bariatric surgery and has attended the seminar.  She is waiting to be able to financially move forward with this decision.  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  Patient lives with her two daughters and she does the food shopping and cooking.  She likes to experiment with cooking, but she is often constrained by time.  She states they eat out about 2 times a week to places like fast-food.  She states her problem is eating late at night once she has gotten the kids to bed.  Her nutrition goal is to be under 200 lbs.  She has family history of diabetes, high cholesterol, and hypertension.  Patient wants to get healthy to avoid some of these conditions.   Learning Readiness:  Contemplating   DIETARY INTAKE:  24-hr recall:  B (8-9 AM): breakfast sandwich on wheat toast with bacon, egg, and cheese and strawberry yogurt and coffee with hazelnut creamer  Snk ( AM): none  L (1 PM): boiled chicken with cream of chicken and rice with water Snk ( PM): none D (5 PM): 2 hotdogs from a convenience store with mustard, chili, slaw, onions with water Snk (10 PM): salmon patty and fried potatoes with water Beverages: coffee, water, occasionally a ginger ale  Usual physical activity: walk 30 min-1 hour or Zumba 1-1.5 hours, 2 days a week  Progress Towards Goal(s):  In progress.   Nutritional Diagnosis:  Volcano-3.3 Overweight/obesity As related to disordered eating patterns with large portion sizes late in the day.  As evidenced by dietary recall and BMI >30.    Intervention:  Nutrition counseling for weight loss.  Discussed aspects of a well balanced meal using the MyPlate portion method.  Patient liked this method a lot and her goal is to start using this method at every meal for her and her  kids.  Described components of healthy snacks and provided handout.  Recommended patient begin using snacks between meals to help her avoid going longer than 4-5 hours without eating.  Explained that these smaller more regular eating times will help reduce late night snacking and over eating.  Praised patient for staying consistent with her exercise and encouraged her to increase her physical activity levels gradually.  Patient wanted to know if these strategies were appropriate for her 48 year old daughter who is also overweight.  RD stated these methods are great tools to work on together as a family and also encouraged they be active together.  Discussed bariatric surgery.  Stated that healthy, sustainable weight loss goals would be to lose 1-2 lbs a week.  Suggested she choose 1-2 goals from "nutritional strategies for weight loss" handout and practice making these new habits for 2-3 weeks and then choose 1-2 more goals to start working on.  Invited patient to sign her and her daughters up for our pediatric health living class offered at [facility].   Teaching Method Utilized: Visual Auditory  Handouts given during visit include:  Nutritional Strategies for Weight Loss  Low Carb Snack Suggestions  Barriers to learning/adherence to lifestyle change: none  Demonstrated degree of understanding via:  Teach Back   Monitoring/Evaluation:  Dietary intake, exercise, portion control, and body weight prn.

## 2015-03-18 ENCOUNTER — Other Ambulatory Visit (INDEPENDENT_AMBULATORY_CARE_PROVIDER_SITE_OTHER): Payer: Self-pay

## 2015-03-18 DIAGNOSIS — Z01818 Encounter for other preprocedural examination: Secondary | ICD-10-CM

## 2015-04-17 ENCOUNTER — Ambulatory Visit (HOSPITAL_COMMUNITY): Payer: 59

## 2015-04-21 ENCOUNTER — Ambulatory Visit: Payer: 59 | Admitting: Dietician

## 2015-04-24 ENCOUNTER — Ambulatory Visit (HOSPITAL_COMMUNITY): Payer: 59

## 2015-04-24 ENCOUNTER — Other Ambulatory Visit (HOSPITAL_COMMUNITY): Payer: 59

## 2015-04-30 ENCOUNTER — Ambulatory Visit (HOSPITAL_COMMUNITY)
Admission: RE | Admit: 2015-04-30 | Discharge: 2015-04-30 | Disposition: A | Discharge status: Home / Self Care | Payer: 59 | Source: Ambulatory Visit | Attending: Surgery | Admitting: Surgery

## 2015-04-30 ENCOUNTER — Encounter (HOSPITAL_COMMUNITY)
Admission: RE | Disposition: A | Discharge status: Home / Self Care | Payer: Self-pay | Source: Ambulatory Visit | Attending: Surgery

## 2015-04-30 ENCOUNTER — Other Ambulatory Visit: Payer: Self-pay

## 2015-04-30 HISTORY — PX: BREATH TEK H PYLORI: SHX5422

## 2015-04-30 SURGERY — BREATH TEST, FOR HELICOBACTER PYLORI

## 2015-04-30 NOTE — Progress Notes (Signed)
   04/30/15 0853  BREATH TEK ASSESSMENT  Referring MD Luretha MurphyMatthew Martin  Time of Last PO Intake 2000  The Patient Had The Following Meds In The Last Two Weeks Proton Pump Inhibitors  Baseline Breath At: 0742  Pranactin Given At: 0743  Post-Dose Breath At: 0758  Sample 1 2.2 %  Sample 2 2.6 %  Test Negative

## 2015-05-04 ENCOUNTER — Encounter (HOSPITAL_COMMUNITY): Payer: Self-pay | Admitting: Surgery

## 2015-05-27 ENCOUNTER — Ambulatory Visit: Payer: 59 | Admitting: Dietician

## 2016-01-12 IMAGING — US US ABDOMEN COMPLETE
1 series · 14 of 25 positions shown · non-contrast
Comparison: None.

CLINICAL DATA: Morbid obesity, pre bariatric surgery evaluation

EXAM:
ULTRASOUND ABDOMEN COMPLETE

[Series 1: us abdomen complete · 0.22mm/px · 14 of 81 slices shown]
[im 1/81]
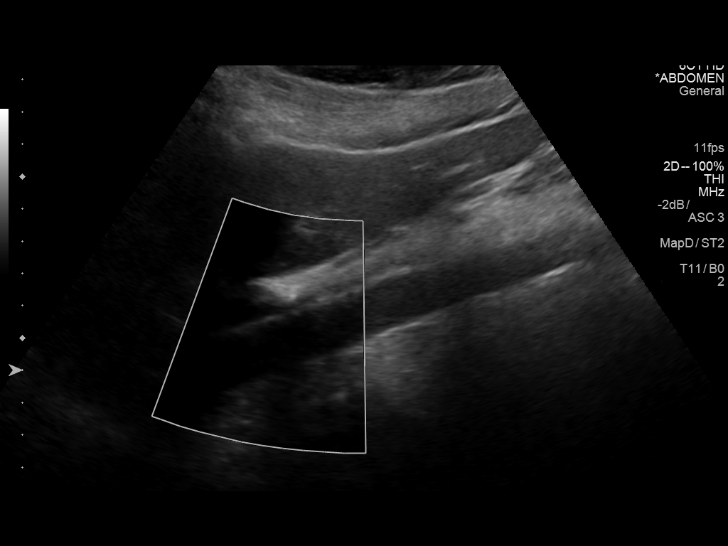
[im 7/81]
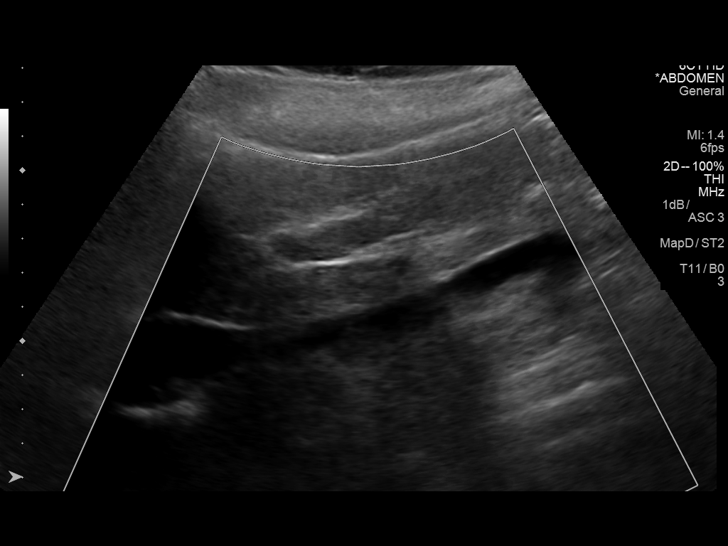
[im 14/81]
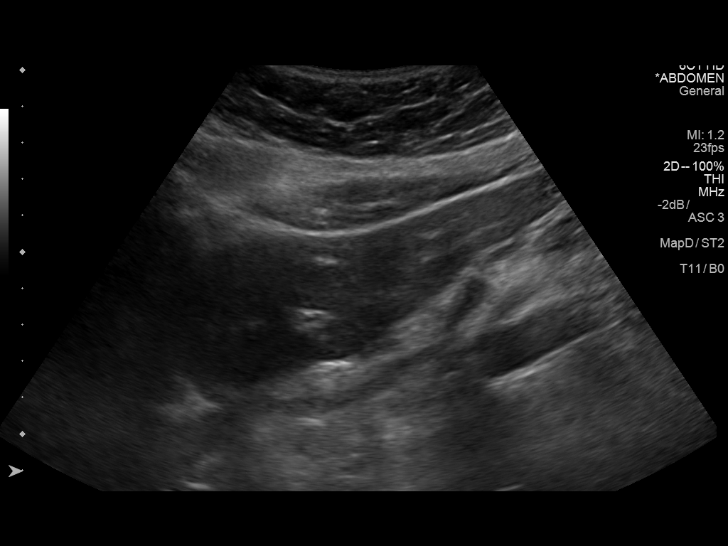
[im 21/81]
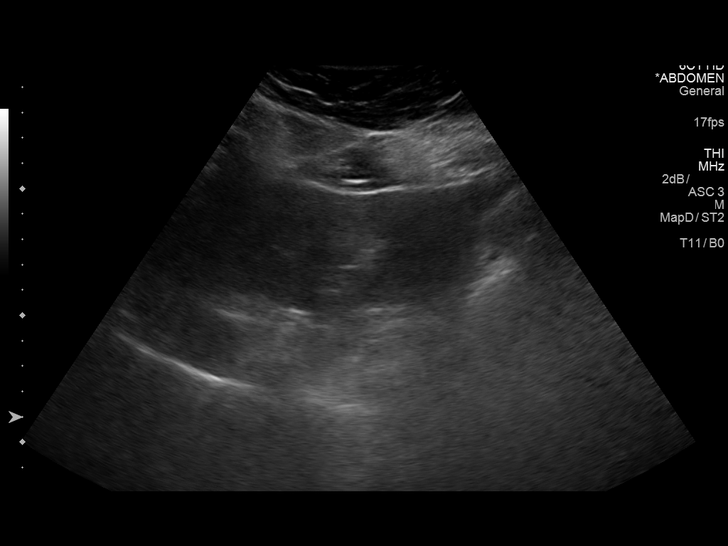
[im 27/81]
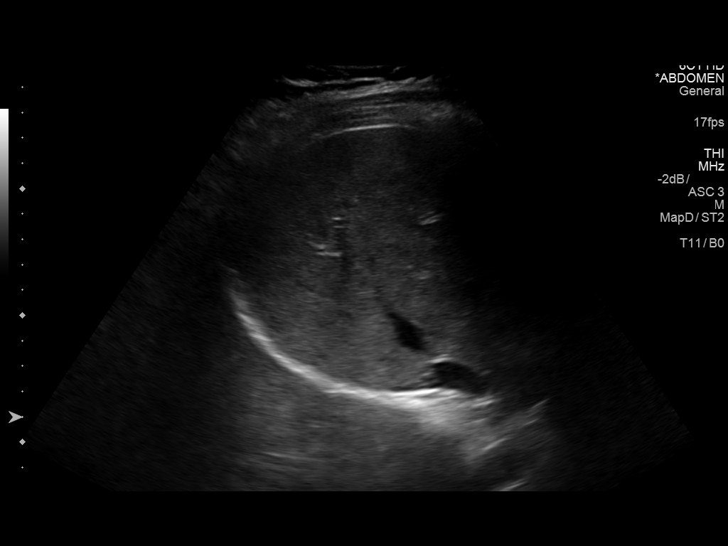
[im 31/81]
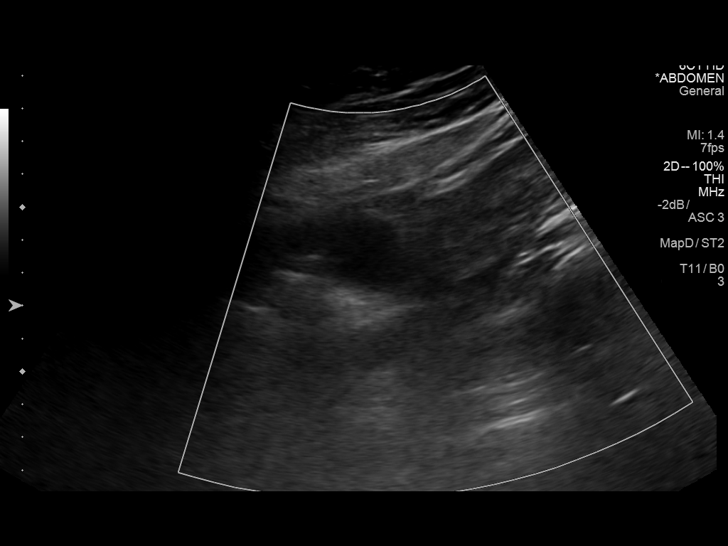
[im 37/81]
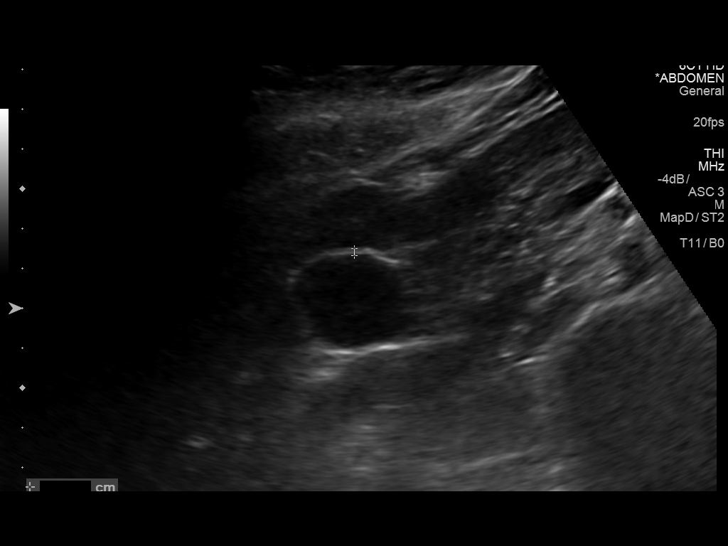
[im 44/81]
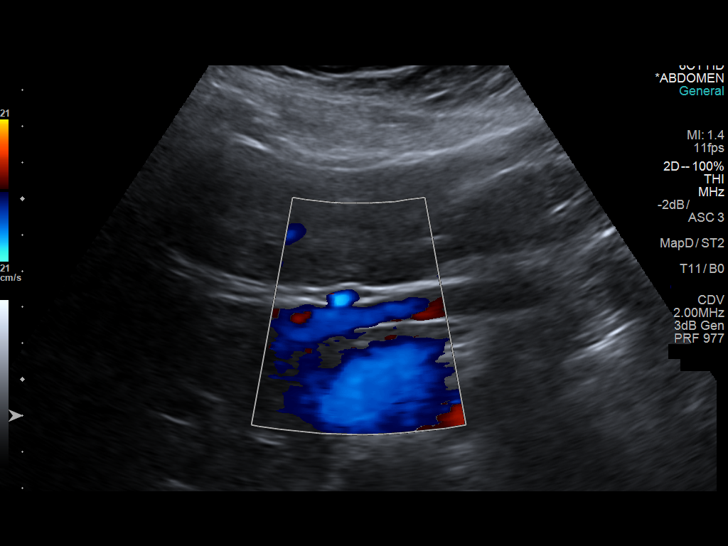
[im 51/81]
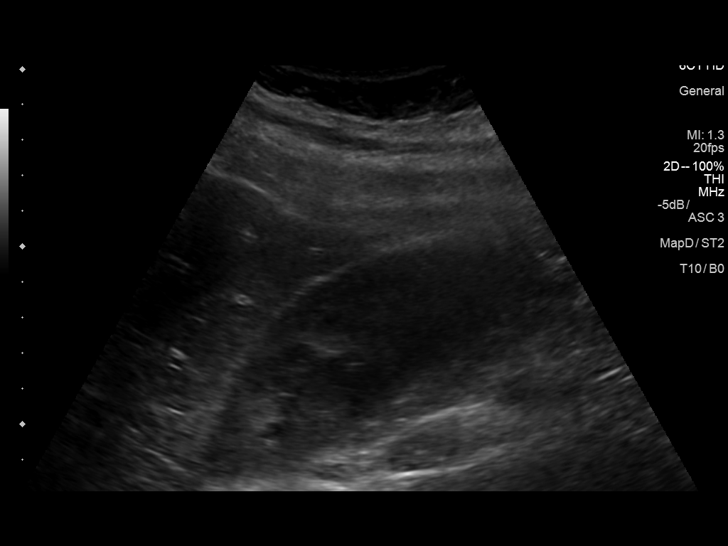
[im 54/81]
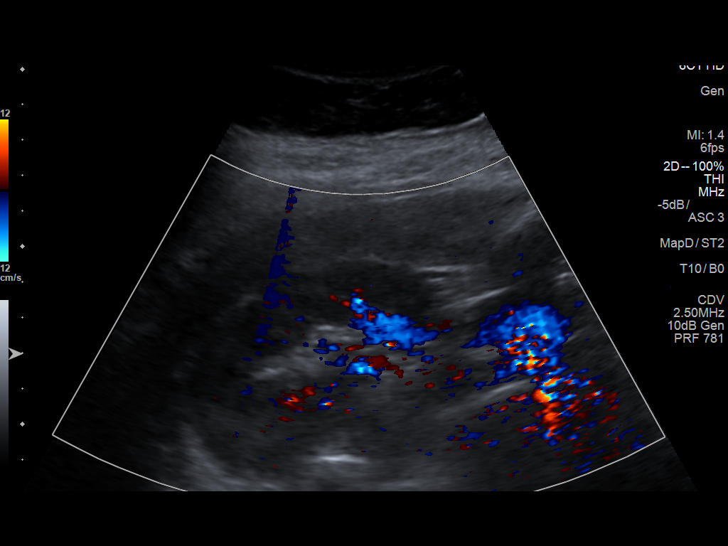
[im 61/81]
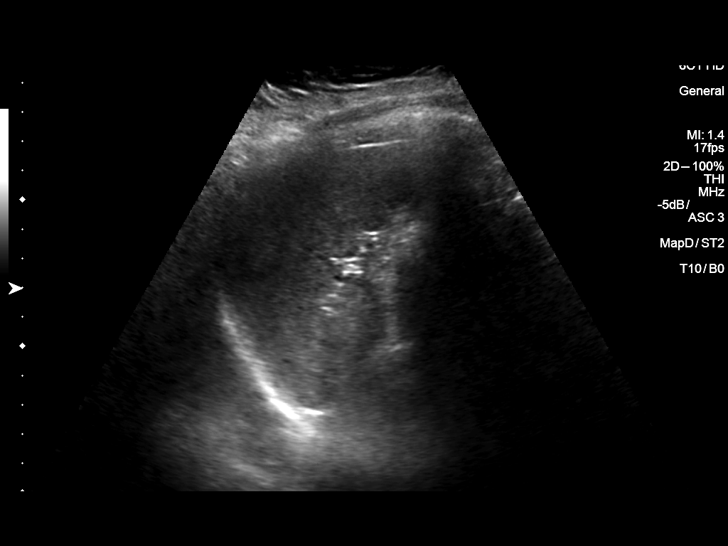
[im 67/81]
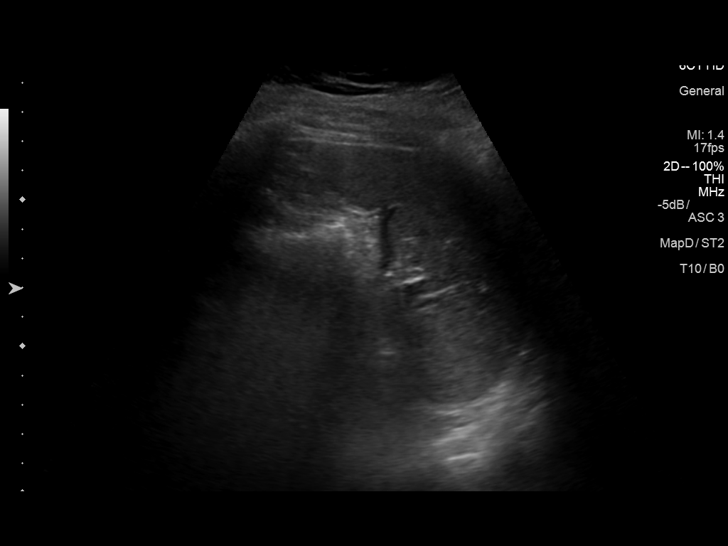
[im 74/81]
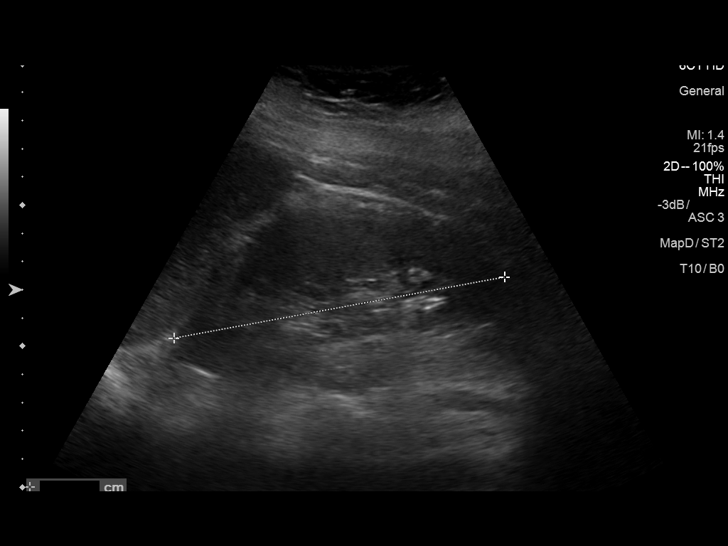
[im 81/81]
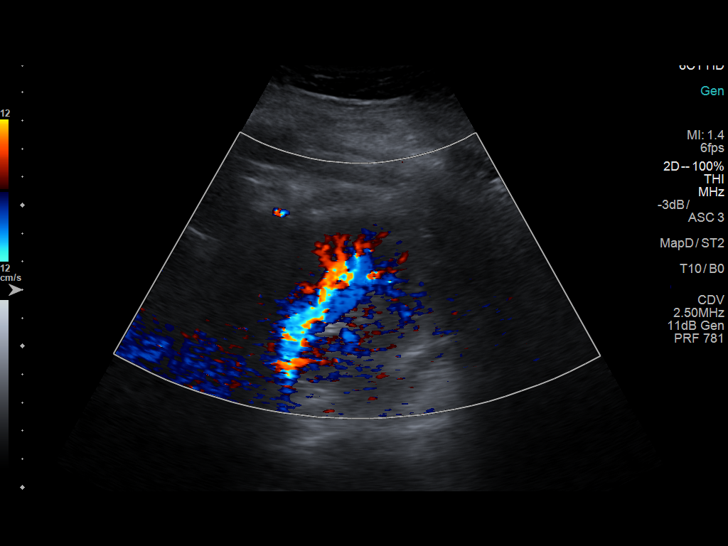

[14 of 25 positions shown; findings below may reference images not displayed]

FINDINGS: Gallbladder: No gallstones or wall thickening visualized. No
sonographic Murphy sign noted.

Common bile duct: Diameter: 2 mm

Liver: No focal lesion identified. Within normal limits in
parenchymal echogenicity.

IVC: No abnormality visualized.

Pancreas: Visualized portion unremarkable.

Spleen: Size and appearance within normal limits.

Right Kidney: Length: 11.3 cm. Echogenicity within normal limits. No
mass or hydronephrosis visualized.

Left Kidney: Length: 12.3 cm. Echogenicity within normal limits. No
mass or hydronephrosis visualized.

Abdominal aorta: No aneurysm visualized.

Other findings: None.
IMPRESSION: No acute intra-abdominal finding by ultrasound

## 2016-01-12 MED FILL — LABETALOL HCL 100 MG TABLET: 100 | 90 days supply | Qty: 180 | Fill #0

## 2016-02-02 DIAGNOSIS — Z113 Encounter for screening for infections with a predominantly sexual mode of transmission: Secondary | ICD-10-CM | POA: Diagnosis not present

## 2016-02-02 DIAGNOSIS — N760 Acute vaginitis: Secondary | ICD-10-CM | POA: Diagnosis not present

## 2016-02-02 DIAGNOSIS — Z118 Encounter for screening for other infectious and parasitic diseases: Secondary | ICD-10-CM | POA: Diagnosis not present

## 2016-02-02 MED FILL — TINIDAZOLE 500 MG TABLET: 500 | 2 days supply | Qty: 8 | Fill #0

## 2016-02-16 MED FILL — FLUCONAZOLE 150 MG TABLET: 150 | 1 days supply | Qty: 1 | Fill #0

## 2016-02-26 DIAGNOSIS — Z30431 Encounter for routine checking of intrauterine contraceptive device: Secondary | ICD-10-CM | POA: Diagnosis not present

## 2016-02-26 DIAGNOSIS — B379 Candidiasis, unspecified: Secondary | ICD-10-CM | POA: Diagnosis not present

## 2016-02-26 DIAGNOSIS — N760 Acute vaginitis: Secondary | ICD-10-CM | POA: Diagnosis not present

## 2016-02-26 MED FILL — FLUCONAZOLE 150 MG TABLET: 150 | 1 days supply | Qty: 1 | Fill #0

## 2016-02-26 MED FILL — CLINDAMYCIN HCL 300 MG CAPS: 300 | 7 days supply | Qty: 14 | Fill #0

## 2016-03-03 MED FILL — FLUCONAZOLE 150 MG TABLET: 150 | 1 days supply | Qty: 1 | Fill #0

## 2016-04-15 DIAGNOSIS — N898 Other specified noninflammatory disorders of vagina: Secondary | ICD-10-CM | POA: Diagnosis not present

## 2016-04-15 DIAGNOSIS — N760 Acute vaginitis: Secondary | ICD-10-CM | POA: Diagnosis not present

## 2016-04-15 MED FILL — CLINDAMYCIN HCL 300 MG CAP: 300 | 7 days supply | Qty: 14 | Fill #0

## 2016-05-19 MED FILL — FLUCONAZOLE 150 MG TABLET: 150 | 7 days supply | Qty: 3 | Fill #0

## 2016-05-19 MED FILL — CIPROFLOXACIN HCL 500 MG TA: 500 | 14 days supply | Qty: 14 | Fill #0

## 2016-07-29 MED FILL — LABETALOL HCL 100 MG TABLET: 100 | 90 days supply | Qty: 180 | Fill #1

## 2017-02-27 MED FILL — LABETALOL HCL 100 MG TABLET: 100 | 30 days supply | Qty: 60 | Fill #0

## 2017-11-03 ENCOUNTER — Ambulatory Visit (HOSPITAL_COMMUNITY)
Admission: EM | Admit: 2017-11-03 | Discharge: 2017-11-03 | Disposition: A | Discharge status: Home / Self Care | Payer: 59 | Attending: Internal Medicine | Admitting: Internal Medicine

## 2017-11-03 ENCOUNTER — Encounter (HOSPITAL_COMMUNITY): Payer: Self-pay | Admitting: Emergency Medicine

## 2017-11-03 DIAGNOSIS — J069 Acute upper respiratory infection, unspecified: Secondary | ICD-10-CM

## 2017-11-03 DIAGNOSIS — H9201 Otalgia, right ear: Secondary | ICD-10-CM

## 2017-11-03 MED ORDER — FLUTICASONE PROPIONATE 50 MCG/ACT NA SUSP: 1.0000 | Freq: Every day | NASAL | 2 refills | Status: DC

## 2017-11-03 NOTE — ED Triage Notes (Signed)
Pt sts URI sx x 4 days with ear fullness

## 2017-11-03 NOTE — ED Provider Notes (Signed)
[facility]    CSN: [id number] Arrival date & time: 11/03/17  1538     History   Chief Complaint Chief Complaint  Patient presents with  . URI    HPI Alexis Foley is a 31 y.o. female.   Danford BadShanita presents with complaints of cold symptoms with congestion, right ear fullness sensation and hoarse voice. Symptoms started 12/2. Occasionally with cough, worse when she first wakes in the morning and is productive. Otherwise minimal cough throughout the day. Does feel like her nose is running but feels congested. Denies sore throat, denies fevers. No known ill contacts. Denies gi/gu complaints. theraflu has helped at night. Taking emergen-c during the day. Does not feel like her ear is hurting but just feels clogged.    ROS per HPI.       Past Medical History:  Diagnosis Date  . Hypertension    under control; has been on med. x 2 yrs.  . Macromastia 12/2011    Patient Active Problem List   Diagnosis Date Noted  . Back pain 02/28/2011  . Neck sprain and strain 02/17/2011  . Concussion 02/17/2011  . Lumbar strain 02/17/2011  . HYPERTENSION, BENIGN 10/08/2010  . GERD 01/14/2009  . OBESITY 11/07/2007    Past Surgical History:  Procedure Laterality Date  . BREAST REDUCTION SURGERY  01/17/2012   Procedure: MAMMARY REDUCTION BILATERAL (BREAST);  Surgeon: Karie FetchMary A Contogiannis, MD;  Location: [facility];  Service: Plastics;  Laterality: Bilateral;  . BREATH TEK H PYLORI N/A 04/30/2015   Procedure: BREATH TEK H PYLORI;  Surgeon: Luretha MurphyMatthew Martin, MD;  Location[facility];  Service: General;  Laterality: N/A;    OB History    No data available       Home Medications    Prior to Admission medications   Medication Sig Start Date End Date Taking? Authorizing Provider  fluticasone (FLONASE) 50 MCG/ACT nasal spray Place 1 spray into both nostrils daily. 11/03/17   Georgetta HaberBurky, Natalie B, NP  labetalol (NORMODYNE) 100 MG tablet Take 1 tablet (100 mg total) by  mouth 2 (two) times daily. 04/06/11   Salley Scarleturham, Kawanta F, MD  levonorgestrel (MIRENA) 20 MCG/24HR IUD 1 each by Intrauterine route once.    [provider]    Family History History reviewed. No pertinent family history.  Social History Social History   Tobacco Use  . Smoking status: Never Smoker  . Smokeless tobacco: Never Used  Substance Use Topics  . Alcohol use: Yes    Comment: socially  . Drug use: No     Allergies   Patient has no known allergies.   Review of Systems Review of Systems   Physical Exam Triage Vital Signs ED Triage Vitals [11/03/17 1550]  Enc Vitals Group     BP (!) 150/100     Pulse Rate 90     Resp 18     Temp 97.9 F (36.6 C)     Temp Source Oral     SpO2 98 %     Weight      Height      Head Circumference      Peak Flow      Pain Score      Pain Loc      Pain Edu?      Excl. in GC?    No data found.  Updated Vital Signs BP (!) 150/100 (BP Location: Left Arm)   Pulse 90   Temp 97.9 F (36.6 C) (Oral)  Resp 18   SpO2 98%   Visual Acuity Right Eye Distance:   Left Eye Distance:   Bilateral Distance:    Right Eye Near:   Left Eye Near:    Bilateral Near:     Physical Exam  Constitutional: She is oriented to person, place, and time. She appears well-developed and well-nourished. No distress.  HENT:  Head: Normocephalic and atraumatic.  Right Ear: External ear and ear canal normal. A middle ear effusion is present.  Left Ear: Tympanic membrane, external ear and ear canal normal.  Nose: Nose normal.  Mouth/Throat: Uvula is midline, oropharynx is clear and moist and mucous membranes are normal. No tonsillar exudate.  Eyes: Conjunctivae and EOM are normal. Pupils are equal, round, and reactive to light.  Cardiovascular: Normal rate, regular rhythm and normal heart sounds.  Pulmonary/Chest: Effort normal and breath sounds normal.  Lymphadenopathy:    She has no cervical adenopathy.  Neurological: She is alert and  oriented to person, place, and time.  Skin: Skin is warm and dry.     UC Treatments / Results  Labs (all labs ordered are listed, but only abnormal results are displayed) Labs Reviewed - No data to display  EKG  EKG Interpretation None       Radiology No results found.  Procedures Procedures (including critical care time)  Medications Ordered in UC Medications - No data to display   Initial Impression / Assessment and Plan / UC Course  I have reviewed the triage vital signs and the nursing notes.  Pertinent labs & imaging results that were available during my care of the patient were reviewed by me and considered in my medical decision making (see chart for details).     Non toxic in appearance, non distressed, vitals stable. Benign physical findings. Continue with supporitve cares for likely viral illness. Flonase, mucinex, tylenol/ibuprofen for symptoms. Push fluids to ensure adequate hydration and keep secretions thin.  If symptoms worsen or do not improve in the next week to return to be seen or to follow up with PCP.  Patient verbalized understanding and agreeable to plan.    Final Clinical Impressions(s) / UC Diagnoses   Final diagnoses:  Viral upper respiratory tract infection  Right ear pain    ED Discharge Orders        Ordered    fluticasone (FLONASE) 50 MCG/ACT nasal spray  Daily     11/03/17 1600       Controlled Substance Prescriptions [place] Controlled Substance Registry consulted? Not Applicable   Georgetta HaberBurky, Natalie B, NP 11/03/17 1606

## 2017-11-03 NOTE — Discharge Instructions (Signed)
Push fluids to ensure adequate hydration and keep secretions thin.  Daily flonase to help with ear pain and congestion. Nasal saline may also help to promote drainage. Mucinex as an expectorant may also be helpful.  Tylenol and/or ibuprofen as needed for pain or fevers.  If symptoms worsen or do not improve in the next week to return to be seen or to follow up with PCP.

## 2018-07-09 MED FILL — TINIDAZOLE 500 MG TABS: 500 | 2 days supply | Qty: 8 | Fill #0

## 2018-07-09 MED FILL — FLUCONAZOLE 150 MG TABS: 150 | 1 days supply | Qty: 1 | Fill #0

## 2018-07-12 DIAGNOSIS — Z30433 Encounter for removal and reinsertion of intrauterine contraceptive device: Secondary | ICD-10-CM | POA: Diagnosis not present

## 2018-07-13 DIAGNOSIS — H5213 Myopia, bilateral: Secondary | ICD-10-CM | POA: Diagnosis not present

## 2018-08-09 DIAGNOSIS — Z30431 Encounter for routine checking of intrauterine contraceptive device: Secondary | ICD-10-CM | POA: Diagnosis not present

## 2018-08-09 DIAGNOSIS — N760 Acute vaginitis: Secondary | ICD-10-CM | POA: Diagnosis not present

## 2018-08-09 MED FILL — FLUCONAZOLE 150 MG TABS: 150 | 7 days supply | Qty: 3 | Fill #0

## 2018-08-09 MED FILL — CLINDAMYCIN HCL 300 MG CAP: 300 | 7 days supply | Qty: 14 | Fill #0

## 2018-10-17 MED FILL — valACYclovir HCL 1 GM TABS: 1 | 15 days supply | Qty: 30 | Fill #0

## 2018-12-11 DIAGNOSIS — Z118 Encounter for screening for other infectious and parasitic diseases: Secondary | ICD-10-CM | POA: Diagnosis not present

## 2018-12-11 DIAGNOSIS — B373 Candidiasis of vulva and vagina: Secondary | ICD-10-CM | POA: Diagnosis not present

## 2018-12-11 DIAGNOSIS — N760 Acute vaginitis: Secondary | ICD-10-CM | POA: Diagnosis not present

## 2018-12-11 MED FILL — FLUCONAZOLE 150 MG TABS: 150 | 7 days supply | Qty: 3 | Fill #0

## 2018-12-11 MED FILL — CLINDAMYCIN HCL 300 MG CAPS: 300 | 5 days supply | Qty: 10 | Fill #0

## 2019-01-23 MED FILL — valACYclovir HCL 1 GM TABS: 1 | 15 days supply | Qty: 30 | Fill #1

## 2019-03-05 DIAGNOSIS — I10 Essential (primary) hypertension: Secondary | ICD-10-CM | POA: Diagnosis not present

## 2019-04-29 MED FILL — valACYclovir HCL 1 GM TABS: 1 | 5 days supply | Qty: 10 | Fill #0

## 2019-04-29 MED FILL — LABETALOL HCL 100 MG TABS: 100 | 30 days supply | Qty: 60 | Fill #0

## 2019-06-03 MED FILL — valACYclovir HCL 1 GM TABS: 1 | 5 days supply | Qty: 10 | Fill #1

## 2019-06-03 MED FILL — LABETALOL HCL 100MG TABLET: 100 | 30 days supply | Qty: 60 | Fill #1

## 2019-07-31 DIAGNOSIS — Z8249 Family history of ischemic heart disease and other diseases of the circulatory system: Secondary | ICD-10-CM | POA: Diagnosis not present

## 2019-07-31 DIAGNOSIS — M545 Low back pain: Secondary | ICD-10-CM | POA: Diagnosis not present

## 2019-07-31 DIAGNOSIS — G8929 Other chronic pain: Secondary | ICD-10-CM | POA: Diagnosis not present

## 2019-07-31 DIAGNOSIS — I10 Essential (primary) hypertension: Secondary | ICD-10-CM | POA: Diagnosis not present

## 2019-08-07 DIAGNOSIS — Z8249 Family history of ischemic heart disease and other diseases of the circulatory system: Secondary | ICD-10-CM | POA: Diagnosis not present

## 2019-08-07 DIAGNOSIS — I10 Essential (primary) hypertension: Secondary | ICD-10-CM | POA: Diagnosis not present

## 2019-08-09 ENCOUNTER — Other Ambulatory Visit: Payer: Self-pay | Admitting: General Surgery

## 2019-08-09 ENCOUNTER — Other Ambulatory Visit (HOSPITAL_COMMUNITY): Payer: Self-pay | Admitting: General Surgery

## 2019-08-16 ENCOUNTER — Other Ambulatory Visit (HOSPITAL_COMMUNITY): Payer: Self-pay | Admitting: General Surgery

## 2019-08-20 ENCOUNTER — Ambulatory Visit (HOSPITAL_COMMUNITY)
Admission: RE | Admit: 2019-08-20 | Discharge: 2019-08-20 | Disposition: A | Discharge status: Home / Self Care | Payer: 59 | Source: Ambulatory Visit | Attending: General Surgery | Admitting: General Surgery

## 2019-08-20 ENCOUNTER — Other Ambulatory Visit: Payer: Self-pay

## 2019-08-20 ENCOUNTER — Ambulatory Visit (HOSPITAL_COMMUNITY): Payer: 59

## 2019-08-20 ENCOUNTER — Encounter (HOSPITAL_COMMUNITY): Payer: Self-pay

## 2019-08-20 DIAGNOSIS — Z01818 Encounter for other preprocedural examination: Secondary | ICD-10-CM | POA: Diagnosis not present

## 2019-09-02 ENCOUNTER — Ambulatory Visit (INDEPENDENT_AMBULATORY_CARE_PROVIDER_SITE_OTHER): Payer: 59 | Admitting: Psychology

## 2019-09-04 DIAGNOSIS — F509 Eating disorder, unspecified: Secondary | ICD-10-CM | POA: Diagnosis not present

## 2019-09-04 MED FILL — LABETALOL HCL 100MG TABLET: 100 | 90 days supply | Qty: 180 | Fill #0

## 2019-09-04 MED FILL — valACYclovir HCL 1 GM TABS: 1 | 5 days supply | Qty: 10 | Fill #2

## 2019-09-12 ENCOUNTER — Ambulatory Visit: Payer: 59 | Admitting: Psychology

## 2019-09-12 DIAGNOSIS — F509 Eating disorder, unspecified: Secondary | ICD-10-CM | POA: Diagnosis not present

## 2019-09-19 DIAGNOSIS — F509 Eating disorder, unspecified: Secondary | ICD-10-CM | POA: Diagnosis not present

## 2019-09-24 ENCOUNTER — Other Ambulatory Visit: Payer: Self-pay

## 2019-09-24 ENCOUNTER — Encounter: Payer: Self-pay | Admitting: Dietician

## 2019-09-24 ENCOUNTER — Encounter: Payer: 59 | Attending: General Surgery | Admitting: Dietician

## 2019-09-24 DIAGNOSIS — E669 Obesity, unspecified: Secondary | ICD-10-CM | POA: Insufficient documentation

## 2019-09-24 DIAGNOSIS — Z23 Encounter for immunization: Secondary | ICD-10-CM | POA: Diagnosis not present

## 2019-09-24 DIAGNOSIS — I10 Essential (primary) hypertension: Secondary | ICD-10-CM | POA: Diagnosis not present

## 2019-09-24 DIAGNOSIS — Z0000 Encounter for general adult medical examination without abnormal findings: Secondary | ICD-10-CM | POA: Diagnosis not present

## 2019-09-24 NOTE — Progress Notes (Signed)
Nutrition Assessment for Bariatric Surgery Medical Nutrition Therapy  Appt Start Time: 9:15am    End Time: 10:15am   Patient was seen on 09/24/2019 for Pre-Operative Nutrition Assessment. Letter of approval faxed to [facility] bariatric surgery program coordinator on 09/24/2019.   Referral stated Supervised Weight Loss (SWL) visits needed: 0  Planned surgery: Sleeve Pt expectation of surgery: to come off of medications, lose and maintain weight loss, prevent future health problems  Pt expectation of dietitian: answer questions regarding diet and supplements    NUTRITION ASSESSMENT   Anthropometrics  Start weight at [facility]: 281 lbs (date: 09/24/2019) Height: 65 in BMI: 46.8 kg/m2    Clinical  Medical hx: obesity, HTN Medications: labetalol     Lifestyle & Dietary Hx Patient is very personable. Lives with her husband and 2 daughters. 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 States weight gain began after she became pregnant at age 33. Tried multiple diets/plans including keto, Weight Watchers, and Atkins. Has a friend who had bypass surgery, is looking forward to surgery.   Current meal pattern involves intermittent fasting, eats about 2 meals per day between 1pm and 9pm. Does not snack. States she drinks a lot of water, and thinks the most challenging part of surgery habits will be learning to sip and not gulp fluids.   24-Hr Dietary Recall First Meal: - Snack: - Second Meal: (1pm) 2 pieces bacon, 1 piece sausage, 2 eggs Snack: - Third Meal: (8pm) [employer] cobb salad (chicken, bacon, cheese, avocado) + cookie  Snack: - Beverages: coffee w/ sugar-free creamer, water, wine   Estimated Energy Needs Calories: 1800 Carbohydrate: 200g Protein: 113g Fat: 60g   NUTRITION DIAGNOSIS  Overweight/obesity ([place]-3.3) related to past poor dietary habits and physical inactivity as evidenced by patient w/ planned Sleeve Gastrectomy surgery following dietary  guidelines for continued weight loss.    NUTRITION INTERVENTION  Nutrition counseling (C-1) and education (E-2) to facilitate bariatric surgery goals.  Pre-Op Goals Reviewed with the Patient . Track food and beverage intake (pen and paper, MyFitness Pal, Baritastic app, etc.) . Make healthy food choices while monitoring portion sizes . Consume 3 meals per day or try to eat every 3-5 hours . Avoid concentrated sugars and fried foods . Keep sugar & fat in the single digits per serving on food labels . Practice CHEWING your food (aim for applesauce consistency) . Practice not drinking 15 minutes before, during, and 30 minutes after each meal and snack . Avoid all carbonated beverages (ex: soda, sparkling beverages)  . Limit caffeinated beverages (ex: coffee, tea, energy drinks) . Avoid all sugar-sweetened beverages (ex: regular soda, sports drinks)  . Avoid alcohol  . Aim for 64-100 ounces of FLUID daily (with at least half of fluid intake being plain water)  . Aim for at least 60-80 grams of PROTEIN daily . Look for a liquid protein source that contains ?15 g protein and ?5 g carbohydrate (ex: shakes, drinks, shots) . Make a list of non-food related activities . Physical activity is an important part of a healthy lifestyle so keep it moving! The goal is to reach 150 minutes of exercise per week, including cardiovascular and weight baring activity.  *Goals that are bolded indicate the pt would like to start working towards these  Handouts Provided Include  . Bariatric Surgery handouts (Nutrition Visits, Pre-Op Goals, Protein Shakes, Vitamins & Minerals)  Learning Style & Readiness for Change Teaching method utilized: Visual & Auditory  Demonstrated degree of understanding via:  Teach Back  Barriers to learning/adherence to lifestyle change: None Identified    MONITORING & EVALUATION Dietary intake, weekly physical activity, body weight, and pre-op goals reached at next nutrition  visit.   Next Steps Patient is to call [facility] to enroll in Pre-Op Class (>2 weeks before surgery) and Post-Op Class (2 weeks after surgery) for further nutrition education when surgery date is scheduled.

## 2019-09-26 DIAGNOSIS — F509 Eating disorder, unspecified: Secondary | ICD-10-CM | POA: Diagnosis not present

## 2019-10-03 DIAGNOSIS — F509 Eating disorder, unspecified: Secondary | ICD-10-CM | POA: Diagnosis not present

## 2019-10-10 DIAGNOSIS — F509 Eating disorder, unspecified: Secondary | ICD-10-CM | POA: Diagnosis not present

## 2019-10-11 ENCOUNTER — Ambulatory Visit (INDEPENDENT_AMBULATORY_CARE_PROVIDER_SITE_OTHER): Payer: 59 | Admitting: Psychology

## 2019-10-31 DIAGNOSIS — F509 Eating disorder, unspecified: Secondary | ICD-10-CM | POA: Diagnosis not present

## 2019-11-08 ENCOUNTER — Ambulatory Visit: Payer: Self-pay | Admitting: General Surgery

## 2019-11-08 DIAGNOSIS — I10 Essential (primary) hypertension: Secondary | ICD-10-CM | POA: Diagnosis not present

## 2019-11-08 DIAGNOSIS — G8929 Other chronic pain: Secondary | ICD-10-CM | POA: Diagnosis not present

## 2019-11-08 DIAGNOSIS — Z8249 Family history of ischemic heart disease and other diseases of the circulatory system: Secondary | ICD-10-CM | POA: Diagnosis not present

## 2019-11-08 DIAGNOSIS — M545 Low back pain: Secondary | ICD-10-CM | POA: Diagnosis not present

## 2019-11-08 NOTE — H&P (View-Only) (Signed)
Alexis Foley Documented: 11/08/2019 3:20 PM Location: [facility] Patient #: [id number] DOB: 1986/07/25 Married / Language: English / Race: Black or African American Female  History of Present Illness Alexis Hiss M. Erikson Danzy MD; 11/08/2019 3:49 PM) The patient is a 33 year old female who presents for a bariatric surgery evaluation. She comes in today for additional follow-up regarding her obesity and weight loss surgery. She denies any medical changes since we initially met in September. She has completed her bariatric surgery evaluation. Psychological consultation has been performed and no concerns. She has a remote upper GI in 2016 which was unremarkable. Bariatric evaluation labs including a CBC, metabolic panel, lipid panel, TSH and A1c were all unremarkable. Her blood glucose level was 109 better A1c was 5.6. She denies any chest pain, chest pressure, source of breath, orthopnea. She denies any heartburn or reflux or indigestion.  07/31/19 She is referred by Dr Alexis Foley for evaluation of weight loss surgery. She attended our seminar in person. She was initially interested in lap band but is now interested in sleeve gastrectomy. Despite several attempts for sustained weight loss she has been unsuccessful. She has tried Atkins and [employer] on several occasions without any long-term success. She is also try ketogenic diet and was successful but when she went off of it she regained the weight back. She states that she has issues with carbohydrates.  Her comorbidities include hypertension, chronic low back pain without sciatica  She denies any chest pain, chest pressure, source of breath, orthopnea, paroxysmal nocturnal dyspnea, dyspnea on exertion, personal family history of blood clots. She may have some ankle edema at the end of the work day. She states that her mom has significant cardiac disease. Her mom had to have heart bypass surgery at age 43. Sleep apnea  questionnaire was low risk. She denies any heartburn, reflux or indigestion. She denies any trouble swallowing liquids or solids. She has a daily bowel movement. She denies any melena or hematochezia. She denies any prior abdominal surgery. She denies any hematuria or dysuria. She has a IUD. She has chronic low back pain without sciatica. She denies any diagnosis of diabetes. She denies any TIAs or amaurosis fugax. She denies any severe headaches on a frequent basis  She denies any drugs or tobacco. She may drink alcohol on a social occasion.  She works for [employer] Alexis Hiss M. Redmond Pulling, MD; 11/08/2019 3:51 PM) CHRONIC MIDLINE LOW BACK PAIN WITHOUT SCIATICA (M54.5) FAMILY HISTORY OF HEART DISEASE IN FEMALE FAMILY MEMBER BEFORE AGE 56 (Z82.49) SEVERE OBESITY (E66.01)  Past Surgical History Alexis Hiss M. Redmond Pulling, MD; 11/08/2019 3:51 PM) Mammoplasty; Reduction Bilateral.  Diagnostic Studies History Alexis Hiss M. Redmond Pulling, MD; 11/08/2019 3:51 PM) Colonoscopy never Mammogram never Pap Smear 1-5 years ago  Allergies (Alexis Foley, Alexis Foley; 11/08/2019 3:22 PM) No Known Drug Allergies [03/18/2015]: Allergies Reconciled  Medication History (Alexis Foley, Alexis Foley; 11/08/2019 3:22 PM) Labetalol HCl (100MG Tablet, Oral) Active. Mirena (52 MG) (20MCG/24HR IUD, Intrauterine) Active. Medications Reconciled  Social History Alexis Hiss M. Redmond Pulling, MD; 11/08/2019 3:51 PM) Alcohol use Moderate alcohol use. Caffeine use Coffee. No drug use Tobacco use Never smoker.  Family History Alexis Hiss M. Redmond Pulling, MD; 11/08/2019 3:51 PM) Diabetes Mellitus Mother. Heart Disease Mother. Heart disease in female family member before age 15 Heart disease in female family member before age 57 Hypertension Mother.  Pregnancy / Birth History Alexis Hiss M. Redmond Pulling, MD; 11/08/2019 3:51 PM) Age at menarche 41 years. Contraceptive History Intrauterine device.  Gravida 2 Irregular  periods Maternal age 60-20 Para 2  Other Problems Alexis Hiss M. Redmond Pulling, MD; 11/08/2019 3:51 PM) ESSENTIAL HYPERTENSION (I10)     Review of Systems Alexis Hiss M. [name]; 11/08/2019 3:49 PM) All other systems negative  Vitals (Alexis Foley RMA; 11/08/2019 3:21 PM) 11/08/2019 3:21 PM Weight: 285.6 lb Height: 65in Body Surface Area: 2.3 m Body Mass Index: 47.53 kg/m  Temp.: 97.18F  Pulse: 101 (Regular)  BP: 132/86 (Sitting, Left Arm, Standard)        Physical Exam Alexis Hiss M. [name]; 11/08/2019 3:49 PM)  General Mental Status-Alert. General Appearance-Consistent with stated age. Hydration-Well hydrated. Voice-Normal. Note: severe obesity; evenly distributed  Head and Neck Head-normocephalic, atraumatic with no lesions or palpable masses. Trachea-midline. Thyroid Gland Characteristics - normal size and consistency.  Eye Eyeball - Bilateral-Extraocular movements intact. Sclera/Conjunctiva - Bilateral-No scleral icterus.  ENMT Ears Pinna - Bilateral - no bony growth in lateral aspect of ear canal, no drainage observed. Nose and Sinuses External Inspection of the Nose - symmetric, no deformities observed.  Chest and Lung Exam Chest and lung exam reveals -quiet, even and easy respiratory effort with no use of accessory muscles and on auscultation, normal breath sounds, no adventitious sounds and normal vocal resonance. Inspection Chest Wall - Normal. Back - normal.  Breast - Did not examine.  Cardiovascular Cardiovascular examination reveals -normal heart sounds, regular rate and rhythm with no murmurs and normal pedal pulses bilaterally.  Abdomen Inspection Inspection of the abdomen reveals - No Hernias. Skin - Scar - no surgical scars. Palpation/Percussion Palpation and Percussion of the abdomen reveal - Soft, Non Tender, No Rebound tenderness, No Rigidity (guarding) and No hepatosplenomegaly. Auscultation Auscultation of  the abdomen reveals - Bowel sounds normal.  Peripheral Vascular Upper Extremity Palpation - Pulses bilaterally normal.  Neurologic Neurologic evaluation reveals -alert and oriented x 3 with no impairment of recent or remote memory. Mental Status-Normal.  Neuropsychiatric The patient's mood and affect are described as -normal. Judgment and Insight-insight is appropriate concerning matters relevant to self.  Musculoskeletal Normal Exam - Left-Upper Extremity Strength Normal and Lower Extremity Strength Normal. Normal Exam - Right-Upper Extremity Strength Normal and Lower Extremity Strength Normal.  Lymphatic Head & Neck  General Head & Neck Lymphatics: Bilateral - Description - Normal. Axillary - Did not examine. Femoral & Inguinal - Did not examine.    Assessment & Plan Alexis Hiss M. Donette Mainwaring MD; 11/08/2019 3:51 PM)  SEVERE OBESITY (E66.01) Impression: The patient meets weight loss surgery criteria. I think the patient would be an acceptable candidate for Laparoscopic vertical sleeve gastrectomy.  We discussed her radiological results including upper GI, chest x-ray and EKG. We reviewed her blood work. We rediscussed the typical hospitalization as well as the typical postoperative course. She read the surgical consent form and had no additional questions. I offered to rediscussed steps as well as risk and benefits of surgery but she declined. We did discuss operating during the coronavirus pandemic. She has an upcoming bariatric preop education class. 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 Redmond Pulling, MD, FACS General, Bariatric, & Minimally Invasive Surgery [facility], [place]

## 2019-11-08 NOTE — H&P (Signed)
Alexis Foley Documented: 11/08/2019 3:20 PM Location: [facility] Patient #: [id number] DOB: [birth year] Married / Language: English / Race: Black or African American Female  History of Present Illness Alexis Foley; 11/08/2019 3:49 PM) The patient is a 33 year old female who presents for a bariatric surgery evaluation. She comes in today for additional follow-up regarding her obesity and weight loss surgery. She denies any medical changes since we initially met in September. She has completed her bariatric surgery evaluation. Psychological consultation has been performed and no concerns. She has a remote upper GI in 2016 which was unremarkable. Bariatric evaluation labs including a CBC, metabolic panel, lipid panel, TSH and A1c were all unremarkable. Her blood glucose level was 109 better A1c was 5.6. She denies any chest pain, chest pressure, source of breath, orthopnea. She denies any heartburn or reflux or indigestion.  07/31/19 She is referred by Dr Laurann Montana for evaluation of weight loss surgery. She attended our seminar in person. She was initially interested in lap band but is now interested in sleeve gastrectomy. Despite several attempts for sustained weight loss she has been unsuccessful. She has tried Atkins and [employer] on several occasions without any long-term success. She is also try ketogenic diet and was successful but when she went off of it she regained the weight back. She states that she has issues with carbohydrates.  Her comorbidities include hypertension, chronic low back pain without sciatica  She denies any chest pain, chest pressure, source of breath, orthopnea, paroxysmal nocturnal dyspnea, dyspnea on exertion, personal family history of blood clots. She may have some ankle edema at the end of the work day. She states that her mom has significant cardiac disease. Her mom had to have heart bypass surgery at age 72. Sleep apnea  questionnaire was low risk. She denies any heartburn, reflux or indigestion. She denies any trouble swallowing liquids or solids. She has a daily bowel movement. She denies any melena or hematochezia. She denies any prior abdominal surgery. She denies any hematuria or dysuria. She has a IUD. She has chronic low back pain without sciatica. She denies any diagnosis of diabetes. She denies any TIAs or amaurosis fugax. She denies any severe headaches on a frequent basis  She denies any drugs or tobacco. She may drink alcohol on a social occasion.  She works for [employer] Alexis Hiss M. 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 Redmond Pulling, Foley; 11/08/2019 3:51 PM) Mammoplasty; Reduction Bilateral.  Diagnostic Studies History Alexis Hiss M. Redmond Pulling, Foley; 11/08/2019 3:51 PM) Colonoscopy never Mammogram never Pap Smear 1-5 years ago  Allergies (Alexis Foley, [place]; 11/08/2019 3:22 PM) No Known Drug Allergies [03/18/2015]: Allergies Reconciled  Medication History (Alexis Foley, [place]; 11/08/2019 3:22 PM) Labetalol HCl (100MG Tablet, Oral) Active. Mirena (52 MG) (20MCG/24HR IUD, Intrauterine) Active. Medications Reconciled  Social History Alexis Hiss M. Redmond Pulling, Foley; 11/08/2019 3:51 PM) Alcohol use Moderate alcohol use. Caffeine use Coffee. No drug use Tobacco use Never smoker.  Family History Alexis Hiss M. Redmond Pulling, Foley; 11/08/2019 3:51 PM) Diabetes Mellitus Mother. Heart Disease Mother. Heart disease in female family member before age 34 Heart disease in female family member before age 69 Hypertension Mother.  Pregnancy / Birth History Alexis Hiss M. Redmond Pulling, Foley; 11/08/2019 3:51 PM) Age at menarche 15 years. Contraceptive History Intrauterine device.  Gravida 2 Irregular  periods Maternal age 33-20 Para 2  Other Problems Alexis Hiss M. Redmond Pulling, Foley; 11/08/2019 3:51 PM) ESSENTIAL HYPERTENSION (I10)     Review of Systems Alexis Hiss M. Jonda Alanis Foley; 11/08/2019 3:49 PM) All other systems negative  Vitals (Alexis Foley; 11/08/2019 3:21 PM) 11/08/2019 3:21 PM Weight: 285.6 lb Height: 65in Body Surface Area: 2.3 m Body Mass Index: 47.53 kg/m  Temp.: 97.36F  Pulse: 101 (Regular)  BP: 132/86 (Sitting, Left Arm, Standard)        Physical Exam Alexis Hiss M. Teffany Blaszczyk Foley; 11/08/2019 3:49 PM)  General Mental Status-Alert. General Appearance-Consistent with stated age. Hydration-Well hydrated. Voice-Normal. Note: severe obesity; evenly distributed  Head and Neck Head-normocephalic, atraumatic with no lesions or palpable masses. Trachea-midline. Thyroid Gland Characteristics - normal size and consistency.  Eye Eyeball - Bilateral-Extraocular movements intact. Sclera/Conjunctiva - Bilateral-No scleral icterus.  ENMT Ears Pinna - Bilateral - no bony growth in lateral aspect of ear canal, no drainage observed. Nose and Sinuses External Inspection of the Nose - symmetric, no deformities observed.  Chest and Lung Exam Chest and lung exam reveals -quiet, even and easy respiratory effort with no use of accessory muscles and on auscultation, normal breath sounds, no adventitious sounds and normal vocal resonance. Inspection Chest Wall - Normal. Back - normal.  Breast - Did not examine.  Cardiovascular Cardiovascular examination reveals -normal heart sounds, regular rate and rhythm with no murmurs and normal pedal pulses bilaterally.  Abdomen Inspection Inspection of the abdomen reveals - No Hernias. Skin - Scar - no surgical scars. Palpation/Percussion Palpation and Percussion of the abdomen reveal - Soft, Non Tender, No Rebound tenderness, No Rigidity (guarding) and No hepatosplenomegaly. Auscultation Auscultation of  the abdomen reveals - Bowel sounds normal.  Peripheral Vascular Upper Extremity Palpation - Pulses bilaterally normal.  Neurologic Neurologic evaluation reveals -alert and oriented x 3 with no impairment of recent or remote memory. Mental Status-Normal.  Neuropsychiatric The patient's mood and affect are described as -normal. Judgment and Insight-insight is appropriate concerning matters relevant to self.  Musculoskeletal Normal Exam - Left-Upper Extremity Strength Normal and Lower Extremity Strength Normal. Normal Exam - Right-Upper Extremity Strength Normal and Lower Extremity Strength Normal.  Lymphatic Head & Neck  General Head & Neck Lymphatics: Bilateral - Description - Normal. Axillary - Did not examine. Femoral & Inguinal - Did not examine.    Assessment & Plan Alexis Hiss M. Jaylen Claude Foley; 11/08/2019 3:51 PM)  SEVERE OBESITY (E66.01) Impression: The patient meets weight loss surgery criteria. I think the patient would be an acceptable candidate for Laparoscopic vertical sleeve gastrectomy.  We discussed her radiological results including upper GI, chest x-ray and EKG. We reviewed her blood work. We rediscussed the typical hospitalization as well as the typical postoperative course. She read the surgical consent form and had no additional questions. I offered to rediscussed steps as well as risk and benefits of surgery but she declined. We did discuss operating during the coronavirus pandemic. She has an upcoming bariatric preop education class. All of her questions were asked and answered  Current Plans Pt Education - EMW_preopbariatric  FAMILY HISTORY OF HEART DISEASE IN FEMALE FAMILY MEMBER BEFORE AGE 12 (Z82.49)   CHRONIC MIDLINE LOW BACK PAIN WITHOUT SCIATICA (M54.5)   ESSENTIAL HYPERTENSION (I10)  Leighton Ruff. Redmond Pulling, Foley, FACS General, Bariatric, & Minimally Invasive Surgery [facility], [place]

## 2019-11-11 ENCOUNTER — Encounter: Payer: 59 | Attending: General Surgery | Admitting: Skilled Nursing Facility1

## 2019-11-11 ENCOUNTER — Other Ambulatory Visit: Payer: Self-pay

## 2019-11-11 DIAGNOSIS — E669 Obesity, unspecified: Secondary | ICD-10-CM | POA: Diagnosis not present

## 2019-11-11 NOTE — Progress Notes (Addendum)
Pre-Operative Nutrition Class:  Appt start time: 8628   End time:  1830.  Patient was seen on 11/11/2019 for Pre-Operative Bariatric Surgery [facility]   Surgery date:  Surgery type: sleeve Start weight at [facility]: 281 Weight today: 287.1    The following the learning objectives were met by the patient during this course:  Identify Pre-Op Dietary Goals and will begin 2 weeks pre-operatively  Identify appropriate sources of fluids and proteins   State protein recommendations and appropriate sources pre and post-operatively  Identify Post-Operative Dietary Goals and will follow for 2 weeks post-operatively  Identify appropriate multivitamin and calcium sources  Describe the need for physical activity post-operatively and will follow MD recommendations  State whento call healthcare provider regarding medication questions or post-operative complications  Handouts given during class include:  Pre-Op Bariatric Surgery Diet Handout  Protein Shake Handout  Post-Op Bariatric Surgery Nutrition Handout  [facility] Program Information Flyer  Support Group Information Flyer  [facility] Bariatric Supplements Price List  Follow-Up Plan: Patient will follow-up at [facility] 2 weeks post operatively for diet advancement per MD.

## 2019-11-15 NOTE — Patient Instructions (Signed)
DUE TO COVID-19 ONLY ONE VISITOR IS ALLOWED TO COME WITH YOU AND STAY IN THE WAITING ROOM ONLY DURING PRE OP AND PROCEDURE DAY OF SURGERY. THE 1 VISITOR MAY VISIT WITH YOU AFTER SURGERY IN YOUR PRIVATE ROOM DURING VISITING HOURS ONLY!  YOU NEED TO HAVE A COVID 19 TEST ON_______ @_______ , THIS TEST MUST BE DONE BEFORE SURGERY, COME  [address], [place] , .  [facility]) ONCE YOUR COVID TEST IS COMPLETED, PLEASE BEGIN THE QUARANTINE INSTRUCTIONS AS OUTLINED IN YOUR HANDOUT.                Alexis Foley  11/15/2019   Your procedure is scheduled on: 11-26-19    Report to [facility] Main  Entrance    Report to Short Stay at 5:30 AM     Call this number if you have problems the morning of surgery [phone]    Remember: NO SOLID FOOD AFTER 600 PM THE NIGHT BEFORE YOUR SURGERY. YOU MAY DRINK CLEAR FLUIDS.   MORNING OF SURGERY DRINK:   DRINK 1 G2 drink BEFORE YOU LEAVE HOME, DRINK ALL OF THE  G2 DRINK AT ONE TIME.  THE G2 DRINK YOU DRINK BEFORE YOU LEAVE HOME WILL BE  THE LAST FLUIDS YOU DRINK BEFORE SURGERY.     CLEAR LIQUID DIET   Foods Allowed                                                                     Foods Excluded  Coffee and tea, regular and decaf                             liquids that you cannot  Plain Jell-O any favor except red or purple                                           see through such as: Fruit ices (not with fruit pulp)                                     milk, soups, orange juice  Iced Popsicles                                    All solid food Carbonated beverages, regular and diet                                    Cranberry, grape and apple juices Sports drinks like Gatorade Lightly seasoned clear broth or consume(fat free) Sugar, honey syrup  Sample Menu Breakfast                                Lunch  Supper Cranberry juice                    Beef broth                             Chicken broth Jell-O                                     Grape juice                           Apple juice Coffee or tea                        Jell-O                                      Popsicle                                                Coffee or tea                        Coffee or tea  _____________________________________________________________________     PAIN IS EXPECTED AFTER SURGERY AND WILL NOT BE COMPLETELY ELIMINATED. AMBULATION AND TYLENOL WILL HELP REDUCE INCISIONAL AND GAS PAIN. MOVEMENT IS KEY!  YOU ARE EXPECTED TO BE OUT OF BED WITHIN 4 HOURS OF ADMISSION TO YOUR PATIENT ROOM.  SITTING IN THE RECLINER THROUGHOUT THE DAY IS IMPORTANT FOR DRINKING FLUIDS AND MOVING GAS THROUGHOUT THE GI TRACT.  COMPRESSION STOCKINGS SHOULD BE WORN [place] UNLESS YOU ARE WALKING.   INCENTIVE SPIROMETER SHOULD BE USED EVERY HOUR WHILE AWAKE TO DECREASE POST-OPERATIVE COMPLICATIONS SUCH AS PNEUMONIA.  WHEN DISCHARGED HOME, IT IS IMPORTANT TO CONTINUE TO WALK EVERY HOUR AND USE THE INCENTIVE SPIROMETER EVERY HOUR.     Take these medicines the morning of surgery with A SIP OF WATER: Labetalol (Normodyne)  BRUSH YOUR TEETH MORNING OF SURGERY AND RINSE YOUR MOUTH OUT, NO CHEWING GUM CANDY OR MINTS.                                 You may not have any metal on your body including hair pins and              piercings     Do not wear jewelry, make-up, lotions, powders or perfumes, deodorant             Do not wear nail polish on your fingernails.  Do not shave  48 hours prior to surgery.             Do not bring valuables to the hospital. [place].  Contacts, dentures or bridgework may not be worn into surgery.  You may bring in an overnight bag     Special Instructions: N/A  Please read over the following fact sheets you were  given: _____________________________________________________________________             [facility] - Preparing for Surgery Before surgery, you can play an important role.  Because skin is not sterile, your skin needs to be as free of germs as possible.  You can reduce the number of germs on your skin by washing with CHG (chlorahexidine gluconate) soap before surgery.  CHG is an antiseptic cleaner which kills germs and bonds with the skin to continue killing germs even after washing. Please DO NOT use if you have an allergy to CHG or antibacterial soaps.  If your skin becomes reddened/irritated stop using the CHG and inform your nurse when you arrive at Short Stay. Do not shave (including legs and underarms) for at least 48 hours prior to the first CHG shower.  You may shave your face/neck. Please follow these instructions carefully:  1.  Shower with CHG Soap the night before surgery and the  morning of Surgery.  2.  If you choose to wash your hair, wash your hair first as usual with your  normal  shampoo.  3.  After you shampoo, rinse your hair and body thoroughly to remove the  shampoo.                           4.  Use CHG as you would any other liquid soap.  You can apply chg directly  to the skin and wash                       Gently with a scrungie or clean washcloth.  5.  Apply the CHG Soap to your body ONLY FROM THE NECK DOWN.   Do not use on face/ open                           Wound or open sores. Avoid contact with eyes, ears mouth and genitals (private parts).                       Wash face,  Genitals (private parts) with your normal soap.             6.  Wash thoroughly, paying special attention to the area where your surgery  will be performed.  7.  Thoroughly rinse your body with warm water from the neck down.  8.  DO NOT shower/wash with your normal soap after using and rinsing off  the CHG Soap.                9.  Pat yourself dry with a clean towel.            10.  Wear  clean pajamas.            11.  Place clean sheets on your bed the night of your first shower and do not  sleep with pets. Day of Surgery : Do not apply any lotions/deodorants the morning of surgery.  Please wear clean clothes to the hospital/surgery center.  FAILURE TO FOLLOW THESE INSTRUCTIONS MAY RESULT IN THE CANCELLATION OF YOUR SURGERY PATIENT SIGNATURE_________________________________  NURSE SIGNATURE__________________________________  ________________________________________________________________________   Alexis Foley  An incentive spirometer is a tool that can help keep your lungs clear and active. This tool measures how well you are filling your lungs with each breath. Taking  long deep breaths may help reverse or decrease the chance of developing breathing (pulmonary) problems (especially infection) following:  A long period of time when you are unable to move or be active. BEFORE THE PROCEDURE   If the spirometer includes an indicator to show your best effort, your nurse or respiratory therapist will set it to a desired goal.  If possible, sit up straight or lean slightly forward. Try not to slouch.  Hold the incentive spirometer in an upright position. INSTRUCTIONS FOR USE  1. Sit on the edge of your bed if possible, or sit up as far as you can in bed or on a chair. 2. Hold the incentive spirometer in an upright position. 3. Breathe out normally. 4. Place the mouthpiece in your mouth and seal your lips tightly around it. 5. Breathe in slowly and as deeply as possible, raising the piston or the ball toward the top of the column. 6. Hold your breath for 3-5 seconds or for as long as possible. Allow the piston or ball to fall to the bottom of the column. 7. Remove the mouthpiece from your mouth and breathe out normally. 8. Rest for a few seconds and repeat Steps 1 through 7 at least 10 times every 1-2 hours when you are awake. Take your time and take a few normal  breaths between deep breaths. 9. The spirometer may include an indicator to show your best effort. Use the indicator as a goal to work toward during each repetition. 10. After each set of 10 deep breaths, practice coughing to be sure your lungs are clear. If you have an incision (the cut made at the time of surgery), support your incision when coughing by placing a pillow or rolled up towels firmly against it. Once you are able to get out of bed, walk around indoors and cough well. You may stop using the incentive spirometer when instructed by your caregiver.  RISKS AND COMPLICATIONS  Take your time so you do not get dizzy or light-headed.  If you are in pain, you may need to take or ask for pain medication before doing incentive spirometry. It is harder to take a deep breath if you are having pain. AFTER USE  Rest and breathe slowly and easily.  It can be helpful to keep track of a log of your progress. Your caregiver can provide you with a simple table to help with this. If you are using the spirometer at home, follow these instructions: Ramirez-Perez IF:   You are having difficultly using the spirometer.  You have trouble using the spirometer as often as instructed.  Your pain medication is not giving enough relief while using the spirometer.  You develop fever of 100.5 F (38.1 C) or higher. SEEK IMMEDIATE MEDICAL CARE IF:   You cough up bloody sputum that had not been present before.  You develop fever of 102 F (38.9 C) or greater.  You develop worsening pain at or near the incision site. MAKE SURE YOU:   Understand these instructions.  Will watch your condition.  Will get help right away if you are not doing well or get worse. Document Released: 03/27/2007 Document Revised: 02/06/2012 Document Reviewed: 05/28/2007 ExitCare Patient Information 2014 ExitCare, [place].   ________________________________________________________________________  WHAT IS A BLOOD  TRANSFUSION? Blood Transfusion Information  A transfusion is the replacement of blood or some of its parts. Blood is made up of multiple cells which provide different functions.  Red blood cells carry oxygen  and are used for blood loss replacement.  White blood cells fight against infection.  Platelets control bleeding.  Plasma helps clot blood.  Other blood products are available for specialized needs, such as hemophilia or other clotting disorders. BEFORE THE TRANSFUSION  Who gives blood for transfusions?   Healthy volunteers who are fully evaluated to make sure their blood is safe. This is blood bank blood. Transfusion therapy is the safest it has ever been in the practice of medicine. Before blood is taken from a donor, a complete history is taken to make sure that person has no history of diseases nor engages in risky social behavior (examples are intravenous drug use or sexual activity with multiple partners). The donor's travel history is screened to minimize risk of transmitting infections, such as malaria. The donated blood is tested for signs of infectious diseases, such as HIV and hepatitis. The blood is then tested to be sure it is compatible with you in order to minimize the chance of a transfusion reaction. If you or a relative donates blood, this is often done in anticipation of surgery and is not appropriate for emergency situations. It takes many days to process the donated blood. RISKS AND COMPLICATIONS Although transfusion therapy is very safe and saves many lives, the main dangers of transfusion include:   Getting an infectious disease.  Developing a transfusion reaction. This is an allergic reaction to something in the blood you were given. Every precaution is taken to prevent this. The decision to have a blood transfusion has been considered carefully by your caregiver before blood is given. Blood is not given unless the benefits outweigh the risks. AFTER THE  TRANSFUSION  Right after receiving a blood transfusion, you will usually feel much better and more energetic. This is especially true if your red blood cells have gotten low (anemic). The transfusion raises the level of the red blood cells which carry oxygen, and this usually causes an energy increase.  The nurse administering the transfusion will monitor you carefully for complications. HOME CARE INSTRUCTIONS  No special instructions are needed after a transfusion. You may find your energy is better. Speak with your caregiver about any limitations on activity for underlying diseases you may have. SEEK MEDICAL CARE IF:   Your condition is not improving after your transfusion.  You develop redness or irritation at the intravenous (IV) site. SEEK IMMEDIATE MEDICAL CARE IF:  Any of the following symptoms occur over the next 12 hours:  Shaking chills.  You have a temperature by mouth above 102 F (38.9 C), not controlled by medicine.  Chest, back, or muscle pain.  People around you feel you are not acting correctly or are confused.  Shortness of breath or difficulty breathing.  Dizziness and fainting.  You get a rash or develop hives.  You have a decrease in urine output.  Your urine turns a dark color or changes to pink, red, or brown. Any of the following symptoms occur over the next 10 days:  You have a temperature by mouth above 102 F (38.9 C), not controlled by medicine.  Shortness of breath.  Weakness after normal activity.  The white part of the eye turns yellow (jaundice).  You have a decrease in the amount of urine or are urinating less often.  Your urine turns a dark color or changes to pink, red, or brown. Document Released: 11/11/2000 Document Revised: 02/06/2012 Document Reviewed: 06/30/2008 [facility] Patient Information 2014 [place], [place].  _______________________________________________________________________

## 2019-11-18 ENCOUNTER — Encounter (HOSPITAL_COMMUNITY)
Admission: RE | Admit: 2019-11-18 | Discharge: 2019-11-18 | Disposition: A | Discharge status: Home / Self Care | Payer: 59 | Source: Ambulatory Visit | Attending: General Surgery | Admitting: General Surgery

## 2019-11-18 ENCOUNTER — Other Ambulatory Visit: Payer: Self-pay

## 2019-11-18 ENCOUNTER — Encounter (HOSPITAL_COMMUNITY): Payer: Self-pay

## 2019-11-18 DIAGNOSIS — Z01812 Encounter for preprocedural laboratory examination: Secondary | ICD-10-CM | POA: Diagnosis not present

## 2019-11-18 LAB — ABO/RH: ABO/RH(D): B POS

## 2019-11-18 LAB — CBC WITH DIFFERENTIAL/PLATELET
Abs Immature Granulocytes: 0.03 10*3/uL (ref 0.00–0.07)
Basophils Absolute: 0 10*3/uL (ref 0.0–0.1)
Basophils Relative: 1 %
Eosinophils Absolute: 0.1 10*3/uL (ref 0.0–0.5)
Eosinophils Relative: 1 %
HCT: 41.9 % (ref 36.0–46.0)
Hemoglobin: 13.9 g/dL (ref 12.0–15.0)
Immature Granulocytes: 0 %
Lymphocytes Relative: 23 %
Lymphs Abs: 1.8 10*3/uL (ref 0.7–4.0)
MCH: 28.9 pg (ref 26.0–34.0)
MCHC: 33.2 g/dL (ref 30.0–36.0)
MCV: 87.1 fL (ref 80.0–100.0)
Monocytes Absolute: 0.7 10*3/uL (ref 0.1–1.0)
Monocytes Relative: 9 %
Neutro Abs: 5.5 10*3/uL (ref 1.7–7.7)
Neutrophils Relative %: 66 %
Platelets: 264 10*3/uL (ref 150–400)
RBC: 4.81 MIL/uL (ref 3.87–5.11)
RDW: 12.9 % (ref 11.5–15.5)
WBC: 8.1 10*3/uL (ref 4.0–10.5)
nRBC: 0 % (ref 0.0–0.2)

## 2019-11-18 LAB — COMPREHENSIVE METABOLIC PANEL
ALT: 25 U/L (ref 0–44)
AST: 17 U/L (ref 15–41)
Albumin: 4.1 g/dL (ref 3.5–5.0)
Alkaline Phosphatase: 44 U/L (ref 38–126)
Anion gap: 8 (ref 5–15)
BUN: 14 mg/dL (ref 6–20)
CO2: 22 mmol/L (ref 22–32)
Calcium: 9 mg/dL (ref 8.9–10.3)
Chloride: 106 mmol/L (ref 98–111)
Creatinine, Ser: 0.68 mg/dL (ref 0.44–1.00)
GFR calc Af Amer: >60 mL/min (ref >60)
GFR calc non Af Amer: >60 mL/min (ref >60)
Glucose, Bld: 98 mg/dL (ref 70–99)
Potassium: 4.4 mmol/L (ref 3.5–5.1)
Sodium: 136 mmol/L (ref 135–145)
Total Bilirubin: 1.3 mg/dL — ABNORMAL HIGH (ref 0.3–1.2)
Total Protein: 7.8 g/dL (ref 6.5–8.1)

## 2019-11-19 DIAGNOSIS — H5213 Myopia, bilateral: Secondary | ICD-10-CM | POA: Diagnosis not present

## 2019-11-19 DIAGNOSIS — H52223 Regular astigmatism, bilateral: Secondary | ICD-10-CM | POA: Diagnosis not present

## 2019-11-21 ENCOUNTER — Other Ambulatory Visit: Payer: Self-pay | Admitting: *Deleted

## 2019-11-21 NOTE — Patient Outreach (Signed)
[facility]) Care Management  11/21/2019  Alexis Foley 1986-01-05 [id number]    Preoperative Screening Call Referral received: 11/19/19 Surgery/procedure date:11/26/19  Insurance: [place] [employer]   Subjective:  Initial successful telephone call to patient's preferred number in order to complete preoperative screening. 2 HIPAA identifiers verified. Discussed purpose of preoperative call. Patient voices understanding and agrees to call.  She confirms having Laparoscopic gastric sleeve resection  and the expected time of arrival. She has  completed the  preoperative testing on 11/18/19 and to return on 12/26 for covid testing,  and has no additional questions. She  expects to be in the hospital 1-2 days.  She has submitted paperwork  to surgeon office for  FMLA to be sent to [employer] She will have  24/7 care at home provided by her husband   to assist in her  recovery. She does not have and is not interested in completing advanced directives at this time. She  agrees to a post hospital discharge transition of care call.    Objective:  Per chart review Alexis Foley is scheduled for Laparoscopic gastric sleeve resection on 11/26/19 at [facility]. She  completed pre-op testing on 12/21, with covid testing on 12/26.  Assessment: Preoperative call completed, no preoperative needs identified.   Plan: RNCM will call patient for transition of care outreach within 72 hours of hospital discharge notification.    Joylene Draft, RN, [place] Management Coordinator  [phone]- Mobile [phone]- Toll Free Main Office

## 2019-11-23 ENCOUNTER — Other Ambulatory Visit (HOSPITAL_COMMUNITY)
Admission: RE | Admit: 2019-11-23 | Discharge: 2019-11-23 | Disposition: A | Discharge status: Home / Self Care | Payer: 59 | Source: Ambulatory Visit | Attending: General Surgery | Admitting: General Surgery

## 2019-11-23 DIAGNOSIS — Z8249 Family history of ischemic heart disease and other diseases of the circulatory system: Secondary | ICD-10-CM | POA: Diagnosis not present

## 2019-11-23 DIAGNOSIS — Z20828 Contact with and (suspected) exposure to other viral communicable diseases: Secondary | ICD-10-CM | POA: Diagnosis not present

## 2019-11-23 DIAGNOSIS — M545 Low back pain: Secondary | ICD-10-CM | POA: Diagnosis not present

## 2019-11-23 DIAGNOSIS — Z6841 Body Mass Index (BMI) 40.0 and over, adult: Secondary | ICD-10-CM | POA: Diagnosis not present

## 2019-11-23 DIAGNOSIS — Z833 Family history of diabetes mellitus: Secondary | ICD-10-CM | POA: Diagnosis not present

## 2019-11-23 DIAGNOSIS — G8929 Other chronic pain: Secondary | ICD-10-CM | POA: Diagnosis not present

## 2019-11-23 DIAGNOSIS — I10 Essential (primary) hypertension: Secondary | ICD-10-CM | POA: Diagnosis not present

## 2019-11-23 DIAGNOSIS — K660 Peritoneal adhesions (postprocedural) (postinfection): Secondary | ICD-10-CM | POA: Diagnosis not present

## 2019-11-23 DIAGNOSIS — Z01812 Encounter for preprocedural laboratory examination: Secondary | ICD-10-CM | POA: Insufficient documentation

## 2019-11-23 LAB — SARS CORONAVIRUS 2 (TAT 6-24 HRS): SARS Coronavirus 2: NEGATIVE

## 2019-11-25 MED ORDER — BUPIVACAINE LIPOSOME 1.3 % IJ SUSP
20.0000 mL | Freq: Once | INTRAMUSCULAR | Status: DC
  Filled 2019-11-25: qty 20

## 2019-11-26 ENCOUNTER — Encounter (HOSPITAL_COMMUNITY)
Admission: RE | Disposition: A | Discharge status: Home / Self Care | Payer: Self-pay | Source: Home / Self Care | Attending: General Surgery

## 2019-11-26 ENCOUNTER — Inpatient Hospital Stay (HOSPITAL_COMMUNITY): Payer: 59 | Admitting: Physician Assistant

## 2019-11-26 ENCOUNTER — Inpatient Hospital Stay (HOSPITAL_COMMUNITY)
Admission: RE | Admit: 2019-11-26 | Discharge: 2019-11-27 | DRG: 621 | Disposition: A | Discharge status: Home / Self Care | Payer: 59 | Attending: General Surgery | Admitting: General Surgery

## 2019-11-26 ENCOUNTER — Encounter (HOSPITAL_COMMUNITY): Payer: Self-pay | Admitting: General Surgery

## 2019-11-26 ENCOUNTER — Inpatient Hospital Stay (HOSPITAL_COMMUNITY): Payer: 59 | Admitting: Certified Registered"

## 2019-11-26 ENCOUNTER — Other Ambulatory Visit: Payer: Self-pay

## 2019-11-26 DIAGNOSIS — G8929 Other chronic pain: Secondary | ICD-10-CM | POA: Diagnosis present

## 2019-11-26 DIAGNOSIS — Z9884 Bariatric surgery status: Secondary | ICD-10-CM

## 2019-11-26 DIAGNOSIS — K660 Peritoneal adhesions (postprocedural) (postinfection): Secondary | ICD-10-CM | POA: Diagnosis present

## 2019-11-26 DIAGNOSIS — Z8249 Family history of ischemic heart disease and other diseases of the circulatory system: Secondary | ICD-10-CM

## 2019-11-26 DIAGNOSIS — M545 Low back pain: Secondary | ICD-10-CM | POA: Diagnosis present

## 2019-11-26 DIAGNOSIS — I10 Essential (primary) hypertension: Secondary | ICD-10-CM | POA: Diagnosis not present

## 2019-11-26 DIAGNOSIS — Z20828 Contact with and (suspected) exposure to other viral communicable diseases: Secondary | ICD-10-CM | POA: Diagnosis present

## 2019-11-26 DIAGNOSIS — Z91013 Allergy to seafood: Secondary | ICD-10-CM

## 2019-11-26 DIAGNOSIS — Z6841 Body Mass Index (BMI) 40.0 and over, adult: Secondary | ICD-10-CM | POA: Diagnosis not present

## 2019-11-26 DIAGNOSIS — Z833 Family history of diabetes mellitus: Secondary | ICD-10-CM | POA: Diagnosis not present

## 2019-11-26 DIAGNOSIS — K219 Gastro-esophageal reflux disease without esophagitis: Secondary | ICD-10-CM | POA: Diagnosis not present

## 2019-11-26 HISTORY — PX: LAPAROSCOPIC GASTRIC SLEEVE RESECTION: SHX5895

## 2019-11-26 LAB — TYPE AND SCREEN
ABO/RH(D): B POS
Antibody Screen: NEGATIVE

## 2019-11-26 LAB — PREGNANCY, URINE: Preg Test, Ur: NEGATIVE

## 2019-11-26 LAB — HEMOGLOBIN AND HEMATOCRIT, BLOOD
HCT: 40.9 % (ref 36.0–46.0)
Hemoglobin: 13.3 g/dL (ref 12.0–15.0)

## 2019-11-26 SURGERY — GASTRECTOMY, SLEEVE, LAPAROSCOPIC
Anesthesia: General | Site: Abdomen

## 2019-11-26 MED ORDER — ENOXAPARIN SODIUM 30 MG/0.3ML ~~LOC~~ SOLN
30.0000 mg | Freq: Two times a day (BID) | SUBCUTANEOUS | Status: DC
  Administered 2019-11-26 – 2019-11-27 (×2): 30 mg via SUBCUTANEOUS
  Filled 2019-11-26 (×2): qty 0.3

## 2019-11-26 MED ORDER — ROCURONIUM BROMIDE 10 MG/ML (PF) SYRINGE
PREFILLED_SYRINGE | INTRAVENOUS | Status: DC | PRN
  Administered 2019-11-26: 70 mg via INTRAVENOUS
  Administered 2019-11-26: 20 mg via INTRAVENOUS

## 2019-11-26 MED ORDER — MORPHINE SULFATE (PF) 2 MG/ML IV SOLN: 1.0000 mg | INTRAVENOUS | Status: DC | PRN

## 2019-11-26 MED ORDER — FENTANYL CITRATE (PF) 100 MCG/2ML IJ SOLN
INTRAMUSCULAR | Status: AC
  Administered 2019-11-26: 09:00:00 50 ug via INTRAVENOUS
  Filled 2019-11-26: qty 2

## 2019-11-26 MED ORDER — LIDOCAINE HCL 2 % IJ SOLN
INTRAMUSCULAR | Status: AC
  Filled 2019-11-26: qty 20

## 2019-11-26 MED ORDER — KETAMINE HCL 10 MG/ML IJ SOLN
INTRAMUSCULAR | Status: DC | PRN
  Administered 2019-11-26: 30 mg via INTRAVENOUS
  Administered 2019-11-26: 15 mg via INTRAVENOUS

## 2019-11-26 MED ORDER — ACETAMINOPHEN 325 MG PO TABS: 325.0000 mg | ORAL_TABLET | Freq: Once | ORAL | Status: DC | PRN

## 2019-11-26 MED ORDER — MIDAZOLAM HCL 2 MG/2ML IJ SOLN
INTRAMUSCULAR | Status: AC
  Filled 2019-11-26: qty 2

## 2019-11-26 MED ORDER — EPHEDRINE 5 MG/ML INJ
INTRAVENOUS | Status: AC
  Filled 2019-11-26: qty 10

## 2019-11-26 MED ORDER — KCL IN DEXTROSE-NACL 20-5-0.45 MEQ/L-%-% IV SOLN
INTRAVENOUS | Status: DC
  Filled 2019-11-26 (×3): qty 1000

## 2019-11-26 MED ORDER — LIDOCAINE 20MG/ML (2%) 15 ML SYRINGE OPTIME
INTRAMUSCULAR | Status: DC | PRN
  Administered 2019-11-26: 1.5 mg/kg/h via INTRAVENOUS

## 2019-11-26 MED ORDER — PHENOL 1.4 % MT LIQD
1.0000 | OROMUCOSAL | Status: DC | PRN
  Filled 2019-11-26: qty 177

## 2019-11-26 MED ORDER — DEXAMETHASONE SODIUM PHOSPHATE 10 MG/ML IJ SOLN
INTRAMUSCULAR | Status: AC
  Filled 2019-11-26: qty 1

## 2019-11-26 MED ORDER — BUPIVACAINE HCL (PF) 0.25 % IJ SOLN
INTRAMUSCULAR | Status: AC
  Filled 2019-11-26: qty 30

## 2019-11-26 MED ORDER — ONDANSETRON HCL 4 MG/2ML IJ SOLN
INTRAMUSCULAR | Status: AC
  Filled 2019-11-26: qty 2

## 2019-11-26 MED ORDER — PHENYLEPHRINE HCL (PRESSORS) 10 MG/ML IV SOLN
INTRAVENOUS | Status: AC
  Filled 2019-11-26: qty 1

## 2019-11-26 MED ORDER — PHENYLEPHRINE 40 MCG/ML (10ML) SYRINGE FOR IV PUSH (FOR BLOOD PRESSURE SUPPORT)
PREFILLED_SYRINGE | INTRAVENOUS | Status: AC
  Filled 2019-11-26: qty 10

## 2019-11-26 MED ORDER — SUCCINYLCHOLINE CHLORIDE 200 MG/10ML IV SOSY
PREFILLED_SYRINGE | INTRAVENOUS | Status: AC
  Filled 2019-11-26: qty 10

## 2019-11-26 MED ORDER — SIMETHICONE 80 MG PO CHEW: 80.0000 mg | CHEWABLE_TABLET | Freq: Four times a day (QID) | ORAL | Status: DC | PRN

## 2019-11-26 MED ORDER — DIPHENHYDRAMINE HCL 50 MG/ML IJ SOLN: 12.5000 mg | Freq: Three times a day (TID) | INTRAMUSCULAR | Status: DC | PRN

## 2019-11-26 MED ORDER — OXYCODONE HCL 5 MG/5ML PO SOLN
5.0000 mg | Freq: Four times a day (QID) | ORAL | Status: DC | PRN
  Administered 2019-11-26: 5 mg via ORAL
  Filled 2019-11-26: qty 5

## 2019-11-26 MED ORDER — GABAPENTIN 300 MG PO CAPS
300.0000 mg | ORAL_CAPSULE | ORAL | Status: AC
  Administered 2019-11-26: 300 mg via ORAL
  Filled 2019-11-26: qty 1

## 2019-11-26 MED ORDER — BUPIVACAINE LIPOSOME 1.3 % IJ SUSP
INTRAMUSCULAR | Status: DC | PRN
  Administered 2019-11-26: 20 mL

## 2019-11-26 MED ORDER — SODIUM CHLORIDE 0.9 % IV SOLN
2.0000 g | INTRAVENOUS | Status: AC
  Administered 2019-11-26: 07:00:00 2 g via INTRAVENOUS
  Filled 2019-11-26: qty 2

## 2019-11-26 MED ORDER — LABETALOL HCL 100 MG PO TABS
100.0000 mg | ORAL_TABLET | Freq: Two times a day (BID) | ORAL | Status: DC
  Administered 2019-11-26 – 2019-11-27 (×2): 100 mg via ORAL
  Filled 2019-11-26 (×2): qty 1

## 2019-11-26 MED ORDER — DEXAMETHASONE SODIUM PHOSPHATE 4 MG/ML IJ SOLN: 4.0000 mg | INTRAMUSCULAR | Status: DC

## 2019-11-26 MED ORDER — PROPOFOL 10 MG/ML IV BOLUS
INTRAVENOUS | Status: DC | PRN
  Administered 2019-11-26: 170 mg via INTRAVENOUS

## 2019-11-26 MED ORDER — ENSURE MAX PROTEIN PO LIQD
2.0000 [oz_av] | ORAL | Status: DC
  Administered 2019-11-27 (×3): 2 [oz_av] via ORAL

## 2019-11-26 MED ORDER — MIDAZOLAM HCL 5 MG/5ML IJ SOLN
INTRAMUSCULAR | Status: DC | PRN
  Administered 2019-11-26: 2 mg via INTRAVENOUS

## 2019-11-26 MED ORDER — LACTATED RINGERS IV SOLN: INTRAVENOUS | Status: DC

## 2019-11-26 MED ORDER — GABAPENTIN 100 MG PO CAPS
200.0000 mg | ORAL_CAPSULE | Freq: Two times a day (BID) | ORAL | Status: DC
  Administered 2019-11-26 – 2019-11-27 (×2): 200 mg via ORAL
  Filled 2019-11-26 (×2): qty 2

## 2019-11-26 MED ORDER — STERILE WATER FOR IRRIGATION IR SOLN
Status: DC | PRN
  Administered 2019-11-26: 1000 mL

## 2019-11-26 MED ORDER — HEPARIN SODIUM (PORCINE) 5000 UNIT/ML IJ SOLN
5000.0000 [IU] | INTRAMUSCULAR | Status: AC
  Administered 2019-11-26: 06:00:00 5000 [IU] via SUBCUTANEOUS
  Filled 2019-11-26: qty 1

## 2019-11-26 MED ORDER — LACTATED RINGERS IR SOLN
Status: DC | PRN
  Administered 2019-11-26: 1000 mL

## 2019-11-26 MED ORDER — LIDOCAINE 2% (20 MG/ML) 5 ML SYRINGE
INTRAMUSCULAR | Status: AC
  Filled 2019-11-26: qty 5

## 2019-11-26 MED ORDER — ROCURONIUM BROMIDE 10 MG/ML (PF) SYRINGE
PREFILLED_SYRINGE | INTRAVENOUS | Status: AC
  Filled 2019-11-26: qty 10

## 2019-11-26 MED ORDER — ACETAMINOPHEN 160 MG/5ML PO SOLN: 1000.0000 mg | Freq: Three times a day (TID) | ORAL | Status: DC

## 2019-11-26 MED ORDER — KETOROLAC TROMETHAMINE 15 MG/ML IJ SOLN
15.0000 mg | Freq: Three times a day (TID) | INTRAMUSCULAR | Status: DC | PRN
  Administered 2019-11-26 – 2019-11-27 (×2): 15 mg via INTRAVENOUS
  Filled 2019-11-26 (×2): qty 1

## 2019-11-26 MED ORDER — PROPOFOL 10 MG/ML IV BOLUS
INTRAVENOUS | Status: AC
  Filled 2019-11-26: qty 40

## 2019-11-26 MED ORDER — ACETAMINOPHEN 500 MG PO TABS
1000.0000 mg | ORAL_TABLET | Freq: Three times a day (TID) | ORAL | Status: DC
  Administered 2019-11-26 – 2019-11-27 (×3): 1000 mg via ORAL
  Filled 2019-11-26 (×3): qty 2

## 2019-11-26 MED ORDER — DEXAMETHASONE SODIUM PHOSPHATE 10 MG/ML IJ SOLN
INTRAMUSCULAR | Status: DC | PRN
  Administered 2019-11-26: 10 mg via INTRAVENOUS

## 2019-11-26 MED ORDER — KETAMINE HCL 10 MG/ML IJ SOLN
INTRAMUSCULAR | Status: AC
  Filled 2019-11-26: qty 1

## 2019-11-26 MED ORDER — SUGAMMADEX SODIUM 200 MG/2ML IV SOLN
INTRAVENOUS | Status: DC | PRN
  Administered 2019-11-26: 300 mg via INTRAVENOUS

## 2019-11-26 MED ORDER — LIDOCAINE 2% (20 MG/ML) 5 ML SYRINGE
INTRAMUSCULAR | Status: DC | PRN
  Administered 2019-11-26: 100 mg via INTRAVENOUS

## 2019-11-26 MED ORDER — PROMETHAZINE HCL 25 MG/ML IJ SOLN: 12.5000 mg | Freq: Four times a day (QID) | INTRAMUSCULAR | Status: DC | PRN

## 2019-11-26 MED ORDER — PROMETHAZINE HCL 25 MG/ML IJ SOLN
INTRAMUSCULAR | Status: AC
  Administered 2019-11-26: 6.25 mg via INTRAVENOUS
  Filled 2019-11-26: qty 1

## 2019-11-26 MED ORDER — SUGAMMADEX SODIUM 500 MG/5ML IV SOLN
INTRAVENOUS | Status: AC
  Filled 2019-11-26: qty 5

## 2019-11-26 MED ORDER — ACETAMINOPHEN 10 MG/ML IV SOLN: 1000.0000 mg | Freq: Once | INTRAVENOUS | Status: DC | PRN

## 2019-11-26 MED ORDER — FENTANYL CITRATE (PF) 250 MCG/5ML IJ SOLN
INTRAMUSCULAR | Status: AC
  Filled 2019-11-26: qty 5

## 2019-11-26 MED ORDER — SCOPOLAMINE 1 MG/3DAYS TD PT72
1.0000 | MEDICATED_PATCH | TRANSDERMAL | Status: DC
  Administered 2019-11-26: 1.5 mg via TRANSDERMAL
  Filled 2019-11-26: qty 1

## 2019-11-26 MED ORDER — MEPERIDINE HCL 50 MG/ML IJ SOLN: 6.2500 mg | INTRAMUSCULAR | Status: DC | PRN

## 2019-11-26 MED ORDER — SUCCINYLCHOLINE CHLORIDE 200 MG/10ML IV SOSY
PREFILLED_SYRINGE | INTRAVENOUS | Status: DC | PRN
  Administered 2019-11-26: 120 mg via INTRAVENOUS

## 2019-11-26 MED ORDER — FENTANYL CITRATE (PF) 100 MCG/2ML IJ SOLN: 25.0000 ug | INTRAMUSCULAR | Status: DC | PRN

## 2019-11-26 MED ORDER — ACETAMINOPHEN 500 MG PO TABS
1000.0000 mg | ORAL_TABLET | ORAL | Status: AC
  Administered 2019-11-26: 1000 mg via ORAL
  Filled 2019-11-26: qty 2

## 2019-11-26 MED ORDER — PROMETHAZINE HCL 25 MG/ML IJ SOLN
6.2500 mg | INTRAMUSCULAR | Status: AC | PRN
  Administered 2019-11-26: 6.25 mg via INTRAVENOUS

## 2019-11-26 MED ORDER — CHLORHEXIDINE GLUCONATE 4 % EX LIQD: 60.0000 mL | Freq: Once | CUTANEOUS | Status: DC

## 2019-11-26 MED ORDER — ONDANSETRON HCL 4 MG/2ML IJ SOLN
INTRAMUSCULAR | Status: DC | PRN
  Administered 2019-11-26: 4 mg via INTRAVENOUS

## 2019-11-26 MED ORDER — PANTOPRAZOLE SODIUM 40 MG IV SOLR
40.0000 mg | Freq: Every day | INTRAVENOUS | Status: DC
  Administered 2019-11-26: 21:00:00 40 mg via INTRAVENOUS
  Filled 2019-11-26: qty 40

## 2019-11-26 MED ORDER — ENALAPRILAT 1.25 MG/ML IV SOLN
1.2500 mg | Freq: Four times a day (QID) | INTRAVENOUS | Status: DC | PRN
  Filled 2019-11-26: qty 1

## 2019-11-26 MED ORDER — APREPITANT 40 MG PO CAPS
40.0000 mg | ORAL_CAPSULE | ORAL | Status: AC
  Administered 2019-11-26: 40 mg via ORAL
  Filled 2019-11-26: qty 1

## 2019-11-26 MED ORDER — ACETAMINOPHEN 160 MG/5ML PO SOLN: 325.0000 mg | Freq: Once | ORAL | Status: DC | PRN

## 2019-11-26 MED ORDER — FENTANYL CITRATE (PF) 100 MCG/2ML IJ SOLN
INTRAMUSCULAR | Status: DC | PRN
  Administered 2019-11-26 (×4): 50 ug via INTRAVENOUS

## 2019-11-26 MED ORDER — ONDANSETRON HCL 4 MG/2ML IJ SOLN: 4.0000 mg | Freq: Four times a day (QID) | INTRAMUSCULAR | Status: DC | PRN

## 2019-11-26 MED ORDER — 0.9 % SODIUM CHLORIDE (POUR BTL) OPTIME
TOPICAL | Status: DC | PRN
  Administered 2019-11-26: 1000 mL

## 2019-11-26 MED ORDER — BUPIVACAINE HCL (PF) 0.25 % IJ SOLN
INTRAMUSCULAR | Status: DC | PRN
  Administered 2019-11-26: 30 mL

## 2019-11-26 SURGICAL SUPPLY — 69 items
APPLICATOR COTTON TIP 6 STRL (MISCELLANEOUS) IMPLANT
APPLICATOR COTTON TIP 6IN STRL (MISCELLANEOUS)
APPLIER CLIP ROT 10 11.4 M/L (STAPLE)
APPLIER CLIP ROT 13.4 12 LRG (CLIP) ×2
BENZOIN TINCTURE PRP APPL 2/3 (GAUZE/BANDAGES/DRESSINGS) ×2 IMPLANT
BLADE SURG SZ11 CARB STEEL (BLADE) ×2 IMPLANT
BNDG ADH 1X3 SHEER STRL LF (GAUZE/BANDAGES/DRESSINGS) ×12 IMPLANT
CABLE HIGH FREQUENCY MONO STRZ (ELECTRODE) IMPLANT
CHLORAPREP W/TINT 26 (MISCELLANEOUS) ×4 IMPLANT
CLIP APPLIE ROT 10 11.4 M/L (STAPLE) IMPLANT
CLIP APPLIE ROT 13.4 12 LRG (CLIP) ×1 IMPLANT
COVER SURGICAL LIGHT HANDLE (MISCELLANEOUS) ×2 IMPLANT
COVER WAND RF STERILE (DRAPES) IMPLANT
DEVICE SUT QUICK LOAD TK 5 (STAPLE) IMPLANT
DEVICE SUT TI-KNOT TK 5X26 (MISCELLANEOUS) IMPLANT
DEVICE SUTURE ENDOST 10MM (ENDOMECHANICALS) IMPLANT
DISSECTOR BLUNT TIP ENDO 5MM (MISCELLANEOUS) IMPLANT
DRAPE UTILITY XL STRL (DRAPES) ×4 IMPLANT
ELECT L-HOOK LAP 45CM DISP (ELECTROSURGICAL)
ELECT REM PT RETURN 15FT ADLT (MISCELLANEOUS) ×2 IMPLANT
ELECTRODE L-HOOK LAP 45CM DISP (ELECTROSURGICAL) IMPLANT
GAUZE SPONGE 2X2 8PLY STRL LF (GAUZE/BANDAGES/DRESSINGS) IMPLANT
GAUZE SPONGE 4X4 12PLY STRL (GAUZE/BANDAGES/DRESSINGS) IMPLANT
GLOVE BIO SURGEON STRL SZ7.5 (GLOVE) ×2 IMPLANT
GLOVE INDICATOR 8.0 STRL GRN (GLOVE) ×2 IMPLANT
GOWN STRL REUS W/TWL XL LVL3 (GOWN DISPOSABLE) ×6 IMPLANT
GRASPER SUT TROCAR 14GX15 (MISCELLANEOUS) ×2 IMPLANT
HOVERMATT SINGLE USE (MISCELLANEOUS) ×2 IMPLANT
KIT BASIN OR (CUSTOM PROCEDURE TRAY) ×2 IMPLANT
KIT PROCEDURE OLYMPUS (MISCELLANEOUS) ×2 IMPLANT
KIT TURNOVER KIT A (KITS) IMPLANT
MARKER SKIN DUAL TIP RULER LAB (MISCELLANEOUS) ×2 IMPLANT
NEEDLE SPNL 22GX3.5 QUINCKE BK (NEEDLE) ×2 IMPLANT
PACK UNIVERSAL I (CUSTOM PROCEDURE TRAY) ×2 IMPLANT
PENCIL SMOKE EVACUATOR (MISCELLANEOUS) IMPLANT
RELOAD STAPLER 60MM BLK (STAPLE) IMPLANT
RELOAD STAPLER BLUE 60MM (STAPLE) ×3 IMPLANT
RELOAD STAPLER GOLD 60MM (STAPLE) ×1 IMPLANT
RELOAD STAPLER GREEN 60MM (STAPLE) ×1 IMPLANT
SCISSORS LAP 5X45 EPIX DISP (ENDOMECHANICALS) IMPLANT
SEALANT SURGICAL APPL DUAL CAN (MISCELLANEOUS) IMPLANT
SET IRRIG TUBING LAPAROSCOPIC (IRRIGATION / IRRIGATOR) ×2 IMPLANT
SET TUBE SMOKE EVAC HIGH FLOW (TUBING) ×2 IMPLANT
SHEARS HARMONIC ACE PLUS 36CM (ENDOMECHANICALS) IMPLANT
SHEARS HARMONIC ACE PLUS 45CM (MISCELLANEOUS) ×2 IMPLANT
SLEEVE GASTRECTOMY 40FR VISIGI (MISCELLANEOUS) ×2 IMPLANT
SLEEVE XCEL OPT CAN 5 100 (ENDOMECHANICALS) ×6 IMPLANT
SOL ANTI FOG 6CC (MISCELLANEOUS) ×1 IMPLANT
SOLUTION ANTI FOG 6CC (MISCELLANEOUS) ×1
SPONGE GAUZE 2X2 STER 10/PKG (GAUZE/BANDAGES/DRESSINGS)
SPONGE LAP 18X18 RF (DISPOSABLE) ×2 IMPLANT
STAPLER ECHELON BIOABSB 60 FLE (MISCELLANEOUS) ×10 IMPLANT
STAPLER ECHELON LONG 60 440 (INSTRUMENTS) ×2 IMPLANT
STAPLER RELOAD 60MM BLK (STAPLE)
STAPLER RELOAD BLUE 60MM (STAPLE) ×6
STAPLER RELOAD GOLD 60MM (STAPLE) ×2
STAPLER RELOAD GREEN 60MM (STAPLE) ×2
STRIP CLOSURE SKIN 1/2X4 (GAUZE/BANDAGES/DRESSINGS) ×2 IMPLANT
SUT MNCRL AB 4-0 PS2 18 (SUTURE) ×2 IMPLANT
SUT SURGIDAC NAB ES-9 0 48 120 (SUTURE) IMPLANT
SUT VICRYL 0 TIES 12 18 (SUTURE) ×2 IMPLANT
SYR 20ML LL LF (SYRINGE) ×2 IMPLANT
SYR 50ML LL SCALE MARK (SYRINGE) ×2 IMPLANT
TOWEL OR 17X26 10 PK STRL BLUE (TOWEL DISPOSABLE) ×2 IMPLANT
TOWEL OR NON WOVEN STRL DISP B (DISPOSABLE) ×2 IMPLANT
TROCAR BLADELESS 15MM (ENDOMECHANICALS) ×2 IMPLANT
TROCAR BLADELESS OPT 5 100 (ENDOMECHANICALS) ×2 IMPLANT
TUBING CONNECTING 10 (TUBING) ×2 IMPLANT
TUBING ENDO SMARTCAP (MISCELLANEOUS) ×2 IMPLANT

## 2019-11-26 NOTE — Transfer of Care (Signed)
Immediate Anesthesia Transfer of Care Note  Patient: Alexis Foley  Procedure(s) Performed: LAPAROSCOPIC GASTRIC SLEEVE RESECTION, UPPER ENDO, ERAS Pathway (N/A Abdomen)  Patient Location: PACU  Anesthesia Type:General  Level of Consciousness: awake, oriented, patient cooperative and responds to stimulation  Airway & Oxygen Therapy: Patient Spontanous Breathing and Patient connected to face mask oxygen  Post-op Assessment: Report given to RN and Post -op Vital signs reviewed and stable  Post vital signs: Reviewed and stable  Last Vitals:  Vitals Value Taken Time  BP 150/115 11/26/19 0903  Temp    Pulse 70 11/26/19 0904  Resp 17 11/26/19 0904  SpO2 100 % 11/26/19 0904  Vitals shown include unvalidated device data.  Last Pain:  Vitals:   11/26/19 0545  TempSrc: Oral         Complications: No apparent anesthesia complications

## 2019-11-26 NOTE — Anesthesia Postprocedure Evaluation (Signed)
Anesthesia Post Note  Patient: Alexis Foley  Procedure(s) Performed: LAPAROSCOPIC GASTRIC SLEEVE RESECTION, UPPER ENDO, ERAS Pathway (N/A Abdomen)     Patient location during evaluation: PACU Anesthesia Type: General Level of consciousness: awake and alert Pain management: pain level controlled Vital Signs Assessment: post-procedure vital signs reviewed and stable Respiratory status: spontaneous breathing, nonlabored ventilation, respiratory function stable and patient connected to nasal cannula oxygen Cardiovascular status: blood pressure returned to baseline and stable Postop Assessment: no apparent nausea or vomiting Anesthetic complications: no    Last Vitals:  Vitals:   11/26/19 1000 11/26/19 1032  BP: (!) 153/111 (!) 154/95  Pulse: 63 (!) 58  Resp: 18 17  Temp: 36.4 C   SpO2: 100% 100%    Last Pain:  Vitals:   11/26/19 1000  TempSrc:   PainSc: [employer]

## 2019-11-26 NOTE — Anesthesia Preprocedure Evaluation (Addendum)
Anesthesia Evaluation  Patient identified by MRN, date of birth, ID band Patient awake    Reviewed: Allergy & Precautions, NPO status , Patient's Chart, lab work & pertinent test results  Airway Mallampati: III  TM Distance: >3 FB Neck ROM: Full    Dental  (+) Teeth Intact, Dental Advisory Given   Pulmonary    breath sounds clear to auscultation       Cardiovascular hypertension, Pt. on home beta blockers  Rhythm:Regular Rate:Normal     Neuro/Psych  Neuromuscular disease negative psych ROS   GI/Hepatic Neg liver ROS, GERD  ,  Endo/Other  negative endocrine ROS  Renal/GU negative Renal ROS     Musculoskeletal negative musculoskeletal ROS (+)   Abdominal (+) + obese,   Peds negative pediatric ROS (+)  Hematology negative hematology ROS (+)   Anesthesia Other Findings   Reproductive/Obstetrics                            Lab Results  Component Value Date   WBC 8.1 11/18/2019   HGB 13.9 11/18/2019   HCT 41.9 11/18/2019   MCV 87.1 11/18/2019   PLT 264 11/18/2019   Lab Results  Component Value Date   CREATININE 0.68 11/18/2019   BUN 14 11/18/2019   NA 136 11/18/2019   K 4.4 11/18/2019   CL 106 11/18/2019   CO2 22 11/18/2019   No results found for: INR, PROTIME  EKG: normal EKG, normal sinus rhythm.  Anesthesia Physical Anesthesia Plan  ASA: III  Anesthesia Plan: General   Post-op Pain Management:    Induction: Intravenous  PONV Risk Score and Plan: 4 or greater and Ondansetron, Dexamethasone, Midazolam and Scopolamine patch - Pre-op  Airway Management Planned: Oral ETT  Additional Equipment: None  Intra-op Plan:   Post-operative Plan: Extubation in OR  Informed Consent: I have reviewed the patients History and Physical, chart, labs and discussed the procedure including the risks, benefits and alternatives for the proposed anesthesia with the patient or authorized  representative who has indicated his/her understanding and acceptance.     Dental advisory given  Plan Discussed with: CRNA  Anesthesia Plan Comments:        Anesthesia Quick Evaluation

## 2019-11-26 NOTE — Progress Notes (Signed)
PHARMACY CONSULT FOR:  Risk Assessment for Post-Discharge VTE Following Bariatric Surgery  Post-Discharge VTE Risk Assessment: This patient's probability of 30-day post-discharge VTE is increased due to the factors marked:   Female    Age >/=60 years    BMI >/=50 kg/m2    CHF    Dyspnea at Rest    Paraplegia  X  Non-gastric-band surgery    Operation Time >/=3 hr    Return to OR     Length of Stay >/= 3 d      Hx of VTE   Hypercoagulable condition   Significant venous stasis   Predicted probability of 30-day post-discharge VTE: 0.16%  Other patient-specific factors to consider: none  Recommendation for Discharge: No pharmacologic prophylaxis post-discharge   Alexis Foley is a 33 y.o. female who underwent lap sleeve gastrectomy on 11/26/2019  Allergies  Allergen Reactions  . Shellfish Allergy Nausea And Vomiting    Patient Measurements: Weight: 279 lb 6.4 oz (126.7 kg) Body mass index is 46.49 kg/m.  Recent Labs    11/26/19 0950  HGB 13.3  HCT 40.9   Estimated Creatinine Clearance: 134.1 mL/min (by C-G formula based on SCr of 0.68 mg/dL).    Past Medical History:  Diagnosis Date  . Hypertension    under control; has been on med. x 2 yrs.  . Macromastia 12/2011     Medications Prior to Admission  Medication Sig Dispense Refill Last Dose  . labetalol (NORMODYNE) 100 MG tablet Take 1 tablet (100 mg total) by mouth 2 (two) times daily. 60 tablet 6 11/26/2019 at 0400  . levonorgestrel (MIRENA) 20 MCG/24HR IUD 1 each by Intrauterine route once.     . valACYclovir (VALTREX) 1000 MG tablet Take 1,000 mg by mouth daily as needed (Flair up).      . fluticasone (FLONASE) 50 MCG/ACT nasal spray Place 1 spray into both nostrils daily. (Patient not taking: Reported on 11/12/2019) 16 g 2 Not Taking at Unknown time   Elenor Quinones, PharmD, BCPS, BCIDP Clinical Pharmacist 11/26/2019 10:58 AM

## 2019-11-26 NOTE — Interval H&P Note (Signed)
History and Physical Interval Note:  11/26/2019 7:12 AM  Alexis Foley  has presented today for surgery, with the diagnosis of Morbid Obesity, HTN, Low Back Pain w/out Sciatica.  The various methods of treatment have been discussed with the patient and family. After consideration of risks, benefits and other options for treatment, the patient has consented to  Procedure(s): LAPAROSCOPIC GASTRIC SLEEVE RESECTION, UPPER ENDO, ERAS Pathway (N/A) as a surgical intervention.  The patient's history has been reviewed, patient examined, no change in status, stable for surgery.  I have reviewed the patient's chart and labs.  Questions were answered to the patient's satisfaction.     Greer Pickerel

## 2019-11-26 NOTE — Anesthesia Procedure Notes (Signed)
Procedure Name: Intubation Date/Time: 11/26/2019 7:32 AM Performed by: Silas Sacramento, CRNA Pre-anesthesia Checklist: Patient identified, Emergency Drugs available, Suction available and Patient being monitored Patient Re-evaluated:Patient Re-evaluated prior to induction Oxygen Delivery Method: Circle system utilized Preoxygenation: Pre-oxygenation with 100% oxygen Induction Type: IV induction, Rapid sequence and Cricoid Pressure applied Laryngoscope Size: Mac and 4 Grade View: Grade I Tube type: Oral Tube size: 7.0 mm Number of attempts: 1 Airway Equipment and Method: Stylet Placement Confirmation: ETT inserted through vocal cords under direct vision,  positive ETCO2 and breath sounds checked- equal and bilateral Secured at: 22 cm Tube secured with: Tape Dental Injury: Teeth and Oropharynx as per pre-operative assessment

## 2019-11-26 NOTE — Op Note (Signed)
Preoperative diagnosis: laparoscopic sleeve gastrectomy  Postoperative diagnosis: Same   Procedure: Upper endoscopy   Surgeon: Vikkie Goeden A Lashandra Arauz, M.D.  Anesthesia: Gen.   Description of procedure: The endoscopy was placed in the mouth and into the oropharynx and under endoscopic vision it was advanced to the esophagogastric junction which was identified at 40 from the teeth.  The pouch was tensely insufflated while the upper abdomen was flooded with irrigation to perform a leak test, which was negative. No bubbles were seen.  The staple line was hemostatic and the lumen was evenly tubular without undue narrowing, angulation or twisting specifically at the incisura angularis. The lumen was decompressed and the scope was withdrawn without difficulty.    Tashira Torre A Zebulan Hinshaw, M.D. General, Bariatric, & Minimally Invasive Surgery [facility]    

## 2019-11-26 NOTE — Progress Notes (Signed)
Discussed post op day goals with patient including ambulation, IS, diet progression, pain, and nausea control.  BSTOP education provided including BSTOP information guide, "Guide for Pain Management after your Bariatric Procedure".  Questions answered. 

## 2019-11-26 NOTE — Discharge Instructions (Signed)
° ° ° °GASTRIC BYPASS/SLEEVE ° Home Care Instructions ° ° These instructions are to help you care for yourself when you go home. ° °Call: If you have any problems. °• Call [phone] and ask for the surgeon on call °• If you need immediate help, come to the ER at .  °• Tell the ER staff that you are a new post-op gastric bypass or gastric sleeve patient °  °Signs and symptoms to report: • Severe vomiting or nausea °o If you cannot keep down clear liquids for longer than 1 day, call your surgeon  °• Abdominal pain that does not get better after taking your pain medication °• Fever over 100.4° F with chills °• Heart beating over 100 beats a minute °• Shortness of breath at rest °• Chest pain °•  Redness, swelling, drainage, or foul odor at incision (surgical) sites °•  If your incisions open or pull apart °• Swelling or pain in calf (lower leg) °• Diarrhea (Loose bowel movements that happen often), frequent watery, uncontrolled bowel movements °• Constipation, (no bowel movements for 3 days) if this happens: Pick one °o Milk of Magnesia, 2 tablespoons by mouth, 3 times a day for 2 days if needed °o Stop taking Milk of Magnesia once you have a bowel movement °o Call your doctor if constipation continues °Or °o Miralax  (instead of Milk of Magnesia) following the label instructions °o Stop taking Miralax once you have a bowel movement °o Call your doctor if constipation continues °• Anything you think is not normal °  °Normal side effects after surgery: • Unable to sleep at night or unable to focus °• Irritability or moody °• Being tearful (crying) or depressed °These are common complaints, possibly related to your anesthesia medications that put you to sleep, stress of surgery, and change in lifestyle.  This usually goes away a few weeks after surgery.  If these feelings continue, call your primary care doctor. °  °Wound Care: You may have surgical glue, steri-strips, or staples over your incisions after  surgery °• Surgical glue:  Looks like a clear film over your incisions and will wear off a little at a time °• Steri-strips: Strips of tape over your incisions. You may notice a yellowish color on the skin under the steri-strips. This is used to make the   steri-strips stick better. Do not pull the steri-strips off - let them fall off °• Staples: Staples may be removed before you leave the hospital °o If you go home with staples, call [facility], [phone] at for an appointment with your surgeon’s nurse to have staples removed 10 days after surgery. °• Showering: You may shower two (2) days after your surgery unless your surgeon tells you differently °o Wash gently around incisions with warm soapy water, rinse well, and gently pat dry  °o No tub baths until staples are removed, steri-strips fall off or glue is gone.  °  °Medications: • Medications should be liquid or crushed if larger than the size of a dime °• Extended release pills (medication that release a little bit at a time through the day) should NOT be crushed or cut. (examples include XL, ER, DR, SR) °• Depending on the size and number of medications you take, you may need to space (take a few throughout the day)/change the time you take your medications so that you do not over-fill your pouch (smaller stomach) °• Make sure you follow-up with your primary care doctor to   make medication changes needed during rapid weight loss and life-style changes °• If you have diabetes, follow up with the doctor that orders your diabetes medication(s) within one week after surgery and check your blood sugar regularly. °• Do not drive while taking prescription pain medication  °• It is ok to take Tylenol by the bottle instructions with your pain medicine or instead of your pain medicine as needed.  DO NOT TAKE NSAIDS (EXAMPLES OF NSAIDS:  IBUPROFREN/ NAPROXEN)  °Diet:                    First 2 Weeks ° You will see the dietician t about two (2) weeks  after your surgery. The dietician will increase the types of foods you can eat if you are handling liquids well: °• If you have severe vomiting or nausea and cannot keep down clear liquids lasting longer than 1 day, call your surgeon @ ([phone]) °Protein Shake °• Drink at least 2 ounces of shake 5-6 times per day °• Each serving of protein shakes (usually 8 - 12 ounces) should have: °o 15 grams of protein  °o And no more than 5 grams of carbohydrate  °• Goal for protein each day: °o Men = 80 grams per day °o Women = 60 grams per day °• Protein powder may be added to fluids such as non-fat milk or Lactaid milk or unsweetened Soy/Almond milk (limit to 35 grams added protein powder per serving) ° °Hydration °• Slowly increase the amount of water and other clear liquids as tolerated (See Acceptable Fluids) °• Slowly increase the amount of protein shake as tolerated  °•  Sip fluids slowly and throughout the day.  Do not use straws. °• May use sugar substitutes in small amounts (no more than 6 - 8 packets per day; i.e. Splenda) ° °Fluid Goal °• The first goal is to drink at least 8 ounces of protein shake/drink per day (or as directed by the nutritionist); some examples of protein shakes are Syntrax Nectar, Adkins Advantage, EAS Edge HP, and Unjury. See handout from pre-op Bariatric Education Class: °o Slowly increase the amount of protein shake you drink as tolerated °o You may find it easier to slowly sip shakes throughout the day °o It is important to get your proteins in first °• Your fluid goal is to drink 64 - 100 ounces of fluid daily °o It may take a few weeks to build up to this °• 32 oz (or more) should be clear liquids  °And  °• 32 oz (or more) should be full liquids (see below for examples) °• Liquids should not contain sugar, caffeine, or carbonation ° °Clear Liquids: °• Water or Sugar-free flavored water (i.e. Fruit H2O, Propel) °• Decaffeinated coffee or tea (sugar-free) °• Crystal Lite, Wyler’s Lite,  Minute Maid Lite °• Sugar-free Jell-O °• Bouillon or broth °• Sugar-free Popsicle:   *Less than 20 calories each; Limit 1 per day ° °Full Liquids: °Protein Shakes/Drinks + 2 choices per day of other full liquids °• Full liquids must be: °o No More Than 15 grams of Carbs per serving  °o No More Than 3 grams of Fat per serving °• Strained low-fat cream soup (except Cream of Potato or Tomato) °• Non-Fat milk °• Fat-free Lactaid Milk °• Unsweetened Soy Or Unsweetened Almond Milk °• Low Sugar yogurt (Dannon Lite & Fit, Greek yogurt; Oikos Triple Zero; Chobani Simply 100; Yoplait 100 calorie Greek - No Fruit on the Bottom) ° °  °Vitamins   and Minerals • Start 1 day after surgery unless otherwise directed by your surgeon °• 2 Chewable Bariatric Specific Multivitamin / Multimineral Supplement with iron (Example: Bariatric Advantage Multi EA) °• Chewable Calcium with Vitamin D-3 °(Example: 3 Chewable Calcium Plus 600 with Vitamin D-3) °o Take 500 mg three (3) times a day for a total of 1500 mg each day °o Do not take all 3 doses of calcium at one time as it may cause constipation, and you can only absorb 500 mg  at a time  °o Do not mix multivitamins containing iron with calcium supplements; take 2 hours apart °• Menstruating women and those with a history of anemia (a blood disease that causes weakness) may need extra iron °o Talk with your doctor to see if you need more iron °• Do not stop taking or change any vitamins or minerals until you talk to your dietitian or surgeon °• Your Dietitian and/or surgeon must approve all vitamin and mineral supplements °  °Activity and Exercise: Limit your physical activity as instructed by your doctor.  It is important to continue walking at home.  During this time, use these guidelines: °• Do not lift anything greater than ten (10) pounds for at least two (2) weeks °• Do not go back to work or drive until your surgeon says you can °• You may have sex when you feel comfortable  °o It is  VERY important for female patients to use a reliable birth control method; fertility often increases after surgery  °o All hormonal birth control will be ineffective for 30 days after surgery due to medications given during surgery a barrier method must be used. °o Do not get pregnant for at least 18 months °• Start exercising as soon as your doctor tells you that you can °o Make sure your doctor approves any physical activity °• Start with a simple walking program °• Walk 5-15 minutes each day, 7 days per week.  °• Slowly increase until you are walking 30-45 minutes per day °Consider joining our BELT program. [phone] or email [email or web address] °  °Special Instructions Things to remember: °• Use your CPAP when sleeping if this applies to you ° °• [facility] has two free Bariatric Surgery Support Groups that meet monthly °o The 3rd Thursday of each month, 6 pm, [facility]  °o The 2nd Friday of each month, 11:45 am in the private dining room in the basement of [facility] °• It is very important to keep all follow up appointments with your surgeon, dietitian, primary care physician, and behavioral health practitioner °• Routine follow up schedule with your surgeon include appointments at 2-3 weeks, 6-8 weeks, 6 months, and 1 year at a minimum.  Your surgeon may request to see you more often.   °o After the first year, please follow up with your bariatric surgeon and dietitian at least once a year in order to maintain best weight loss results °[facility]: [phone] °[facility]: [phone] °Bariatric Nurse Coordinator: [phone] °  °   Reviewed and Endorsed  °by [facility] Patient Education Committee, June, 2016 °Edits Approved: Aug, 2018 ° ° ° °

## 2019-11-26 NOTE — Op Note (Addendum)
11/26/2019 Alexis Foley 1986-02-15 [id number]   PRE-OPERATIVE DIAGNOSIS:     Severe obesity bmi 47   HYPERTENSION, BENIGN   Chronic low back pain without sciatica  POST-OPERATIVE DIAGNOSIS:  same  PROCEDURE:  Procedure(s): LAPAROSCOPIC SLEEVE GASTRECTOMY  UPPER GI ENDOSCOPY  SURGEON:  Surgeon(s): Gayland Curry, MD FACS FASMBS  ASSISTANTS: Romana Juniper MD FACS  ANESTHESIA:   general  DRAINS: none   BOUGIE: 29 fr ViSiGi  LOCAL MEDICATIONS USED:   Exparel  EBL: minimal  Findings: 40 fr visigi; evidence of prior fitz-hugh curtis syndrome - numerous thins adhesions b/t anterior surface of liver (l&r lobes) to anterior abdominal wall as well as adhesive bands under L lobe to anterior stomach  SPECIMEN:  Source of Specimen:  Greater curvature of stomach  DISPOSITION OF SPECIMEN:  PATHOLOGY  COUNTS:  YES  INDICATION FOR PROCEDURE: This is a very pleasant 33 y.o.-year-old morbidly obese female who has had unsuccessful attempts for sustained weight loss. The patient presents today for a planned laparoscopic sleeve gastrectomy with upper endoscopy. We have discussed the risk and benefits of the procedure extensively preoperatively. Please see my separate notes.  PROCEDURE: After obtaining informed consent and receiving 5000 units of subcutaneous heparin, the patient was brought to the operating room at [facility] and placed supine on the operating room table. General endotracheal anesthesia was established. Sequential compression devices were placed. A orogastric tube was placed. The patient's abdomen was prepped and draped in the usual standard surgical fashion. The patient received preoperative IV antibiotic. A surgical timeout was performed. ERAS protocol used.   Access to the abdomen was achieved using a 5 mm 0 laparoscope thru a 5 mm trocar In the left upper Quadrant 2 fingerbreadths below the left subcostal margin using the Optiview technique. Pneumoperitoneum was  smoothly established up to 15 mm of mercury. The laparoscope was advanced and the abdominal cavity was surveilled. The patient was then placed in reverse Trendelenburg.   A 5 mm trocar was placed slightly above and to the left of the umbilicus under direct visualization.   A 5 mm trocar was placed in the lateral right upper quadrant along with a 15 mm trocar in the mid right abdomen. A final 5 mm trocar was placed in the lateral LUQ.  All under direct visualization after exparel had been infiltrated in bilateral lateral upper abdominal walls as a TAP block.  There was some insufflated CO2 underneath the omentum around the transverse colon.  I lifted up the omentum and inspected the transverse colon.  There is no evidence of colotomy.  There is numerous adhesions between the left and right hepatic lobe and in the anterior abdominal wall consistent with prior Fitz-Hugh Curtis syndrome.  There were also some adhesions underneath the left lobe of the liver to the anterior wall of the stomach.  The adhesions around the left lobe of the liver anterior and posterior were taken down with harmonic scalpel in order for liver retractor to be placed. The [facility] liver retractor was placed under the left lobe of the liver through a 5 mm trocar incision site in the subxiphoid position. The stomach was inspected. It was completely decompressed and the orogastric tube was removed.  There was no anterior dimple that was obviously visible.  Her preoperative upper GI showed no hiatal hernia.    We identified the pylorus and measured 6 cm proximal to the pylorus and identified an area of where we would start taking down the short gastric  vessels. Harmonic scalpel was used to take down the short gastric vessels along the greater curvature of the stomach. We were able to enter the lesser sac. We continued to march along the greater curvature of the stomach taking down the short gastrics. As we approached the gastrosplenic  ligament we took care in this area not to injure the spleen. We were able to take down the entire gastrosplenic ligament. We then mobilized the fundus away from the left crus of diaphragm. There were a few posterior gastric avascular attachments which were taken down. This left the stomach completely mobilized. No vessels had been taken down along the lesser curvature of the stomach.  We then reidentified the pylorus. A 40Fr ViSiGi was then placed in the oropharynx and advanced down into the stomach and placed in the distal antrum and positioned along the lesser curvature. It was placed under suction which secured the 40Fr ViSiGi in place along the lesser curve. Then using the Ethicon echelon 60 mm stapler with a green load with Seamguard, I placed a stapler along the antrum approximately 5 cm from the pylorus. The stapler was angled so that there is ample room at the angularis incisura. I then fired the first staple load after inspecting it posteriorly to ensure adequate space both anteriorly and posteriorly. At this point I still was not completely past the angularis so with a gold  load with Seamguard, I placed the stapler in position just inside the prior stapleline. We then rotated the stomach to insure that there was adequate anteriorly as well as posteriorly. The stapler was then fired.  At this point I started using 60 mm blue load staple cartridges with Seamguard. The echelon stapler was then repositioned with a 60 mm blue load with Seamguard and we continued to march up along the ViSiGi. My assistant was holding traction along the greater curvature stomach along the cauterized short gastric vessels ensuring that the stomach was symmetrically retracted. Prior to each firing of the staple, we rotated the stomach to ensure that there is adequate stomach left.  As we approached the fundus, I used 60 mm blue cartridge with Seamguard aiming  lateral to the GE junction after mobilizing some of the esophageal  fat pad.  The sleeve was inspected. There is no evidence of cork screw. The staple line appeared hemostatic. The CRNA inflated the ViSiGi to the green zone and the upper abdomen was flooded with saline. There were no bubbles. The sleeve was decompressed and the ViSiGi removed. My assistant scrubbed out and performed an upper endoscopy. The sleeve easily distended with air and the scope was easily advanced to the pylorus. There is no evidence of internal bleeding or cork screwing. There was no narrowing at the angularis. There is no evidence of bubbles. Please see her operative note for further details. The gastric sleeve was decompressed and the endoscope was removed.  I reinspected the staple along the sleeve gastrectomy.  There was 2 areas where there is a little bit of ooze.  Placed clips in these 2 areas.  There is no signs of additional oozing or bleeding.  The greater curvature the stomach was grasped with a laparoscopic grasper and removed from the 15 mm trocar site.  The liver retractor was removed. I then closed the 15 mm trocar site with 1 interrupted 0 Vicryl sutures through the fascia using the endoclose. The closure was viewed laparoscopically and it was airtight. Remaining Exparel was then infiltrated in the preperitoneal spaces around the  trocar sites. Pneumoperitoneum was released. All trocar sites were closed with a 4-0 Monocryl in a subcuticular fashion followed by the application of benzoin, steri-strips, and bandaids. The patient was extubated and taken to the recovery room in stable condition. All needle, instrument, and sponge counts were correct x2. There are no immediate complications  (1) 60 mm green with Seamguard (1) 60 mm gold with seamguard (3 60 mm blue with  seamguard  PLAN OF CARE: Admit to inpatient   PATIENT DISPOSITION:  PACU - hemodynamically stable.   Delay start of Pharmacological VTE agent (>24hrs) due to surgical blood loss or risk of bleeding:  no  Mary Sella. Andrey Campanile,  MD, FACS FASMBS General, Bariatric, & Minimally Invasive Surgery [facility], [place]

## 2019-11-27 LAB — COMPREHENSIVE METABOLIC PANEL
ALT: 20 U/L (ref 0–44)
AST: 28 U/L (ref 15–41)
Albumin: 3.9 g/dL (ref 3.5–5.0)
Alkaline Phosphatase: 47 U/L (ref 38–126)
Anion gap: 8 (ref 5–15)
BUN: 6 mg/dL (ref 6–20)
CO2: 22 mmol/L (ref 22–32)
Calcium: 8.8 mg/dL — ABNORMAL LOW (ref 8.9–10.3)
Chloride: 107 mmol/L (ref 98–111)
Creatinine, Ser: 0.68 mg/dL (ref 0.44–1.00)
GFR calc Af Amer: >60 mL/min (ref >60)
GFR calc non Af Amer: >60 mL/min (ref >60)
Glucose, Bld: 116 mg/dL — ABNORMAL HIGH (ref 70–99)
Potassium: 4.5 mmol/L (ref 3.5–5.1)
Sodium: 137 mmol/L (ref 135–145)
Total Bilirubin: 1.4 mg/dL — ABNORMAL HIGH (ref 0.3–1.2)
Total Protein: 7.9 g/dL (ref 6.5–8.1)

## 2019-11-27 LAB — CBC WITH DIFFERENTIAL/PLATELET
Abs Immature Granulocytes: 0.07 10*3/uL (ref 0.00–0.07)
Basophils Absolute: 0 10*3/uL (ref 0.0–0.1)
Basophils Relative: 0 %
Eosinophils Absolute: 0 10*3/uL (ref 0.0–0.5)
Eosinophils Relative: 0 %
HCT: 40.3 % (ref 36.0–46.0)
Hemoglobin: 13.5 g/dL (ref 12.0–15.0)
Immature Granulocytes: 1 %
Lymphocytes Relative: 12 %
Lymphs Abs: 1.7 10*3/uL (ref 0.7–4.0)
MCH: 29 pg (ref 26.0–34.0)
MCHC: 33.5 g/dL (ref 30.0–36.0)
MCV: 86.7 fL (ref 80.0–100.0)
Monocytes Absolute: 1 10*3/uL (ref 0.1–1.0)
Monocytes Relative: 7 %
Neutro Abs: 11.6 10*3/uL — ABNORMAL HIGH (ref 1.7–7.7)
Neutrophils Relative %: 80 %
Platelets: 314 10*3/uL (ref 150–400)
RBC: 4.65 MIL/uL (ref 3.87–5.11)
RDW: 13.2 % (ref 11.5–15.5)
WBC: 14.4 10*3/uL — ABNORMAL HIGH (ref 4.0–10.5)
nRBC: 0 % (ref 0.0–0.2)

## 2019-11-27 LAB — SURGICAL PATHOLOGY

## 2019-11-27 MED ORDER — GABAPENTIN 100 MG PO CAPS: 200.0000 mg | ORAL_CAPSULE | Freq: Two times a day (BID) | ORAL | 0 refills | Status: AC

## 2019-11-27 MED ORDER — ACETAMINOPHEN 500 MG PO TABS: 1000.0000 mg | ORAL_TABLET | Freq: Three times a day (TID) | ORAL | 0 refills | Status: AC

## 2019-11-27 MED ORDER — TRAMADOL HCL 50 MG PO TABS: 50.0000 mg | ORAL_TABLET | Freq: Four times a day (QID) | ORAL | 0 refills | Status: AC | PRN

## 2019-11-27 MED ORDER — ONDANSETRON 4 MG PO TBDP: 4.0000 mg | ORAL_TABLET | Freq: Four times a day (QID) | ORAL | 0 refills | Status: AC | PRN

## 2019-11-27 MED ORDER — PANTOPRAZOLE SODIUM 40 MG PO TBEC: 40.0000 mg | DELAYED_RELEASE_TABLET | Freq: Every day | ORAL | 0 refills | Status: AC

## 2019-11-27 MED FILL — GABAPENTIN 100 MG CAPSULE: 100 | 5 days supply | Qty: 20 | Fill #0

## 2019-11-27 MED FILL — PANTOPRAZOLE SOD DR 40 MG T: 40 | 90 days supply | Qty: 90 | Fill #0

## 2019-11-27 MED FILL — traMADol HCL 50 MG TABS: 50 | 2 days supply | Qty: 10 | Fill #0

## 2019-11-27 MED FILL — ONDANSETRON ODT 4 MG TABLET: 4 | 5 days supply | Qty: 20 | Fill #0

## 2019-11-27 NOTE — Progress Notes (Signed)
Patient alert and oriented, pain is controlled. Patient is tolerating fluids, advanced to protein shake today, patient is tolerating well.  Reviewed Gastric sleeve discharge instructions with patient and patient is able to articulate understanding.  Provided information on BELT program, Support Group and [facility] outpatient pharmacy. All questions answered, will continue to monitor.  Total fluid intake 660 Per dehydration protocol call back in one week post op  

## 2019-11-27 NOTE — Plan of Care (Signed)
Pt was discharged home today. Instructions were reviewed with patient, and questions were answered. Pt was taken to main entrance via wheelchair by NT.  

## 2019-11-27 NOTE — Discharge Summary (Signed)
Physician Discharge Summary  Alexis Foley ALP:[id number] DOB: 09/01/1986 DOA: 11/26/2019  PCP: Kelton Pillar, MD  Admit date: 11/26/2019 Discharge date: 11/27/2019  Recommendations for Outpatient Follow-up:   Follow-up Information    Greer Pickerel, MD. Go on 12/20/2019.   Specialty: General Surgery Why: at 2 pm. Contact information: [address] [place] [address] [phone]        Carlena Hurl, PA-C. Go on 01/16/2020.   Specialty: General Surgery Why: at [place] information: [place] [address] [phone]          Discharge Diagnoses:  Principal Problem:   Severe obesity ([place]) Active Problems:   HYPERTENSION, BENIGN   Chronic low back pain without sciatica   S/P laparoscopic sleeve gastrectomy   Surgical Procedure: Laparoscopic Sleeve Gastrectomy, upper endoscopy  Discharge Condition: Good Disposition: Home  Diet recommendation: Postoperative sleeve gastrectomy diet (liquids only)  Filed Weights   11/26/19 0545 11/26/19 1617  Weight: 126.7 kg 126.7 kg     Hospital Course:  The patient was admitted for a planned laparoscopic sleeve gastrectomy. Please see operative note. Preoperatively the patient was given 5000 units of subcutaneous heparin for DVT prophylaxis. Postoperative prophylactic Lovenox dosing was started on the evening of postoperative day 0. ERAS protocol was used. On the evening of postoperative day 0, the patient was started on water and ice chips. On postoperative day 1 the patient had no fever or tachycardia and was tolerating water in their diet was gradually advanced throughout the day. The patient was ambulating without difficulty. Their vital signs are stable without fever or tachycardia. Their hemoglobin had remained stable.  The patient had received discharge instructions and counseling. They were deemed stable for discharge and had met discharge criteria  BP (!) 141/94 (BP Location: Right  Arm)   Pulse 84   Temp 97.8 F (36.6 C) (Oral)   Resp 18   Ht '5\' 5"'  (1.651 m)   Wt 126.7 kg   SpO2 100%   BMI 46.49 kg/m   Gen: alert, NAD, non-toxic appearing Pupils: equal, no scleral icterus Pulm: Lungs clear to auscultation, symmetric chest rise CV: regular rate and rhythm Abd: soft, mild approp ttp, nondistended. Well-healed trocar sites. No cellulitis. No incisional hernia Ext: no edema, no calf tenderness Skin: no rash, no jaundice   Discharge Instructions  Discharge Instructions    Ambulate hourly while awake   Complete by: As directed    Call MD for:  difficulty breathing, headache or visual disturbances   Complete by: As directed    Call MD for:  persistant dizziness or light-headedness   Complete by: As directed    Call MD for:  persistant nausea and vomiting   Complete by: As directed    Call MD for:  redness, tenderness, or signs of infection (pain, swelling, redness, odor or green/yellow discharge around incision site)   Complete by: As directed    Call MD for:  severe uncontrolled pain   Complete by: As directed    Call MD for:  temperature >101 F   Complete by: As directed    Diet bariatric full liquid   Complete by: As directed    Discharge instructions   Complete by: As directed    See bariatric discharge instructions   Incentive spirometry   Complete by: As directed    Perform hourly while awake     Allergies as of 11/27/2019      Reactions   Shellfish  Allergy Nausea And Vomiting      Medication List    STOP taking these medications   fluticasone 50 MCG/ACT nasal spray Commonly known as: FLONASE     TAKE these medications   acetaminophen 500 MG tablet Commonly known as: TYLENOL Take 2 tablets (1,000 mg total) by mouth every 8 (eight) hours for 5 days.   gabapentin 100 MG capsule Commonly known as: NEURONTIN Take 2 capsules (200 mg total) by mouth every 12 (twelve) hours.   labetalol 100 MG tablet Commonly known as:  NORMODYNE Take 1 tablet (100 mg total) by mouth 2 (two) times daily. Notes to patient: Monitor Blood Pressure Daily and keep a log for primary care physician.  You may need to make changes to your medications with rapid weight loss.     levonorgestrel 20 MCG/24HR IUD Commonly known as: MIRENA 1 each by Intrauterine route once.   ondansetron 4 MG disintegrating tablet Commonly known as: ZOFRAN-ODT Take 1 tablet (4 mg total) by mouth every 6 (six) hours as needed for nausea or vomiting.   pantoprazole 40 MG tablet Commonly known as: PROTONIX Take 1 tablet (40 mg total) by mouth daily.   traMADol 50 MG tablet Commonly known as: ULTRAM Take 1 tablet (50 mg total) by mouth every 6 (six) hours as needed (pain).   valACYclovir 1000 MG tablet Commonly known as: VALTREX Take 1,000 mg by mouth daily as needed (Flair up).      Follow-up Information    Greer Pickerel, MD. Go on 12/20/2019.   Specialty: General Surgery Why: at 2 pm. Contact information: [address] [place] [address] [phone]        Carlena Hurl, PA-C. Go on 01/16/2020.   Specialty: General Surgery Why: at [address] Contact information: [place] [address] [phone]            The results of significant diagnostics from this hospitalization (including imaging, microbiology, ancillary and laboratory) are listed below for reference.    Significant Diagnostic Studies: No results found.  Labs: Basic Metabolic Panel: Recent Labs  Lab 11/27/19 0449  NA 137  K 4.5  CL 107  CO2 22  GLUCOSE 116*  BUN 6  CREATININE 0.68  CALCIUM 8.8*   Liver Function Tests: Recent Labs  Lab 11/27/19 0449  AST 28  ALT 20  ALKPHOS 47  BILITOT 1.4*  PROT 7.9  ALBUMIN 3.9    CBC: Recent Labs  Lab 11/26/19 0950 11/27/19 0449  WBC  --  14.4*  NEUTROABS  --  11.6*  HGB 13.3 13.5  HCT 40.9 40.3  MCV  --  86.7  PLT  --  314    CBG: No results for input(s): GLUCAP  in the last 168 hours.  Principal Problem:   Severe obesity ([place]) Active Problems:   HYPERTENSION, BENIGN   Chronic low back pain without sciatica   S/P laparoscopic sleeve gastrectomy   Time coordinating discharge: 15 min  Signed:  Gayland Curry, MD [facility], [place] 11/27/2019, 12:45 PM

## 2019-11-27 NOTE — Progress Notes (Signed)
Nutrition Brief Note  RD consulted for diet education for patient s/p bariatric surgery. Bariatric nurse coordinator providing education at this time.  If nutrition issues arise, please consult RD.   Kahne Helfand, MS, RD, LDN Inpatient Clinical Dietitian Pager: [phone] After Hours Pager: [phone] 

## 2019-11-27 NOTE — Progress Notes (Signed)
Patient alert and oriented, Post op day 1.  Provided support and encouragement.  Encouraged pulmonary toilet, ambulation and small sips of liquids.  Completed 12 ounces of bari clear fluids and 2 ounces of protein. All questions answered.  Will continue to monitor. 

## 2019-11-28 ENCOUNTER — Other Ambulatory Visit: Payer: Self-pay | Admitting: *Deleted

## 2019-11-28 NOTE — Patient Outreach (Signed)
[facility]) Care Management  11/28/2019  Alexis Foley [birth year] [id number]   Transition of care telephone call  Referral received:11/19/19 Initial outreach:12/29, for preop screening call, initial post discharge call on 11/28/19 Insurance: [facility]   Initial unsuccessful telephone call to patient's preferred number in order to complete transition of care assessment; no answer, left HIPAA compliant voicemail message requesting return call.   Objective: Per the electronic medical record, Alexis Foley  was hospitalized at [facility]  From 12/29-12/30   with/for Laparoscopic gastric sleeve resection.  Comorbidities include:Severe Obesity Hypertension He/She was discharged to home on 11/27/19 without the need for home health services or durable medical equipment per the discharge summary.   Plan: This RNCM will route unsuccessful outreach letter with [place] Management pamphlet and 24 hour Nurse Advice Line Magnet to [place] Management clinical pool to be mailed to patient's home address. This RNCM will attempt another outreach within 4 business days.  Joylene Draft, RN, [place] Management Coordinator  [phone]- Mobile [phone]- Toll Free Main Office

## 2019-12-02 ENCOUNTER — Telehealth (HOSPITAL_COMMUNITY): Payer: Self-pay

## 2019-12-02 NOTE — Telephone Encounter (Signed)
Patient called to discuss post bariatric surgery follow up questions.  See below: ° ° °1.  Tell me about your pain and pain management? ° °2.  Let's talk about fluid intake.  How much total fluid are you taking in? ° °3.  How much protein have you taken in the last 2 days? ° °4.  Have you had nausea?  Tell me about when have experienced nausea and what you did to help? ° °5.  Has the frequency or color changed with your urine? ° °6.  Tell me what your incisions look like? ° °7.  Have you been passing gas? BM? ° °8.  If a problem or question were to arise who would you call?  Do you know contact numbers for [facility], [facility], and [facility]? ° °9.  How has the walking going? ° °10.  How are your vitamins and calcium going?  How are you taking them? °

## 2019-12-03 ENCOUNTER — Other Ambulatory Visit: Payer: Self-pay | Admitting: *Deleted

## 2019-12-03 ENCOUNTER — Encounter: Payer: Self-pay | Admitting: *Deleted

## 2019-12-03 NOTE — Patient Outreach (Signed)
[facility]) Care Management  12/03/2019  Alexis Foley August 25, 1986 [id number]   Transition of care call/case closure   Referral received:11/19/19 Initial outreach: 11/21/19 preop screening call ,11/28/19 initial post discharge call.  Insurance: [place] [employer]    Subjective: Initial successful telephone call to patient's preferred number in order to complete transition of care assessment; 2 HIPAA identifiers verified. Explained purpose of call and completed transition of care assessment.  Alexis Foley states that she is feeling very  well, denies post-operative problems, says surgical incisions are unremarkable, states surgical pain well managed with prescribed medications. She discussed making slow progress toward fluid goal related to water/liquid intake , she reports getting in her protein shake requirement  daily and tolerating well, just feels like she is drinking all day. She  denies bowel or bladder problems.  Spouse/children are assisting with her recovery. She reports tolerating walking in home , taking showers, using incentive as least once a day.  She discussed having hypertension that is controlled and anticipates it getting better after surgery. She discussed taking part in blood pressure management class as part health steps in Live life well, she declines need to a referral to one of the [place] chronic disease management programs.  She does  not have the hospital indemnity and is currently on FMLA.  She says she  uses a [employer] outpatient pharmacy - [facility].  She  denies educational needs related to staying safe during the COVID 19 pandemic.    Objective:   Alexis Foley  was hospitalized at [facility]  From 12/29-12/30   with/for Laparoscopic gastric sleeve resection.  Comorbidities include:Severe Obesity Hypertension She was discharged to home on 11/27/19 without the need for home health services or durable medical equipment per the discharge  summary Assessment:  Patient voices good understanding of all discharge instructions.  See transition of care flowsheet for assessment details.   Plan:  Reviewed hospital discharge diagnosis of  Laparoscopic gastric sleeve resection   and discharge treatment plan using hospital discharge instructions, assessing medication adherence, reviewing problems requiring provider notification, and discussing the importance of follow up with surgeon, primary care provider and/or specialists as directed. Reviewed Alexis Foley healthy lifestyle program information to keep premiums low for 2022:  Step 1: Get annual physical between November 28, 2018 and May 28, 2020; Step 2: Complete your health assessment between November 29, 2019 and July 29, 2020 at [email or web address] Step 3:Identify your current health status and complete the corresponding action step between January 1, and July 29, 2020.      No ongoing care management needs identified so will close case to [facility] Care Management services and route successful outreach letter with [facility] Care Management pamphlet and 24 Hour Nurse Line Magnet to [employer] Care Management clinical pool to be mailed to patient's home address. Thanked patient for their services to [facility].  Egbert Garibaldi, RN, PCCN Alumnus  [facility] Care Management,Care Management Coordinator  [phone]- Mobile [phone]- Toll Free Main Office

## 2019-12-10 ENCOUNTER — Encounter: Payer: Self-pay | Admitting: Skilled Nursing Facility1

## 2019-12-10 ENCOUNTER — Encounter: Payer: 59 | Attending: General Surgery | Admitting: Skilled Nursing Facility1

## 2019-12-10 ENCOUNTER — Other Ambulatory Visit: Payer: Self-pay

## 2019-12-10 DIAGNOSIS — E669 Obesity, unspecified: Secondary | ICD-10-CM | POA: Insufficient documentation

## 2019-12-10 NOTE — Progress Notes (Signed)
2 Week Post-Operative Nutrition Class   Patient was seen on 01/22/19 for Post-[facility]ative [facility].    Pt reported having difficulty finding a calcium she enjoyed, and reported rarely getting in 3 a day. Dietitian provided Bariatric Advantage chewable samples  (Lot #- [id number] Exp. Date- 11/30/2020) , and pt reported really enjoying pineapple flavor.  Surgery date: 11/26/2019 Surgery type: sleeve Start weight at [facility]: 281 lbs Weight today: 261.7 lbs   Body Composition Scale Date 12/10/2019  Total Body Fat % 44.8  Visceral Fat 13  Fat-Free Mass % 55.1   Total Body Water % 42.0   Muscle-Mass lbs 33.9  Body Fat Displacement          Torso  lbs 72.8         Left Leg  lbs 14.5         Right Leg  lbs 14.5         Left Arm  lbs 7.2         Right Arm   lbs 7.2     The following the learning objectives were met by the patient during this course:  Identifies Phase 3 (Soft, High Proteins) Dietary Goals and will begin from 2 weeks post-operatively to 2 months post-operatively  Identifies appropriate sources of fluids and proteins   States protein recommendations and appropriate sources post-operatively  Identifies the need for appropriate texture modifications, mastication, and bite sizes when consuming solids  Identifies appropriate multivitamin and calcium sources post-operatively  Describes the need for physical activity post-operatively and will follow MD recommendations  States when to call healthcare provider regarding medication questions or post-operative complications   Handouts given during class include:  Phase 3A: Soft, High Protein Diet Handout   Follow-Up Plan: Patient will follow-up at [facility] in 6 weeks for 2 month post-op nutrition visit for diet advancement per MD.  

## 2019-12-16 ENCOUNTER — Telehealth: Payer: Self-pay | Admitting: Skilled Nursing Facility1

## 2019-12-16 NOTE — Telephone Encounter (Signed)
RD called pt to verify fluid intake once starting soft, solid proteins 2 week post-bariatric surgery.   Daily Fluid intake: 70 Daily Protein intake: 60+  Concerns/issues:   Pt states getting in the protein has been difficult due to timing drink and meals and work and not feeling hunger.

## 2020-01-21 ENCOUNTER — Encounter: Payer: 59 | Attending: General Surgery | Admitting: Skilled Nursing Facility1

## 2020-01-21 ENCOUNTER — Other Ambulatory Visit: Payer: Self-pay

## 2020-01-21 DIAGNOSIS — E669 Obesity, unspecified: Secondary | ICD-10-CM | POA: Insufficient documentation

## 2020-01-21 NOTE — Patient Instructions (Addendum)
-  Set an alarm to remind you to eat  --Continue to aim for a minimum of 64 fluid ounces 7 days a week with at least 30 ounces being plain water  -Eat non-starchy vegetables 2 times a day 7 days a week  -Start out with soft cooked vegetables today and tomorrow; if tolerated begin to eat raw vegetables or cooked including salads  -Eat your 3 ounces of protein first then start in on your non-starchy vegetables; once you understand how much of your meal leads to satisfaction and not full while still eating 3 ounces of protein and non-starchy vegetables you can eat them in any order   -Continue to aim for 30 minutes of activity at least 5 times a week  -Do NOT cook with/add to your food: alfredo sauce, cheese sauce, barbeque sauce, ketchup, fat back, butter, bacon grease, grease, Crisco   -Try plant based proteins

## 2020-01-21 NOTE — Progress Notes (Signed)
Bariatric Nutrition Follow-Up Visit Medical Nutrition Therapy  NUTRITION ASSESSMENT    Anthropometrics  Surgery date: 11/26/2019 Surgery type: sleeve Start weight at [facility]: 281 lbs Weight today: 246.3 lbs   Body Composition Scale Date 12/10/2019 01/21/2020  Total Body Fat % 44.8 43.3  Visceral Fat 13 12  Fat-Free Mass % 55.1 56.6   Total Body Water % 42.0 42.8   Muscle-Mass lbs 33.9 33.7  Body Fat Displacement           Torso  lbs 72.8 66.1         Left Leg  lbs 14.5 13.2         Right Leg  lbs 14.5 13.2         Left Arm  lbs 7.2 6.6         Right Arm   lbs 7.2 6.6     Clinical  Medical hx: GERD, HTN Medications:  Labs:   Lifestyle & Dietary Hx   Pt states she does not tolerate beef, chicken, pork, feeling they get stuck no matter how much she chews or spaces it out so continues to drink protein shakes and eat yogurt. Pt states she started a lunch time walk routine. Pt states he will start BELT next week.    Estimated daily fluid intake: 50 oz Estimated daily protein intake: 60 g Supplements: multi and calcium  Current average weekly physical activity: walking on breaks from work  24-Hr Dietary Recall First Meal: protein shake Snack: decaff coffee with sugar free creamer Second Meal: sometimes beans Snack 2-3pm: can of soup   Third Meal 7pm: fish or shrimp sometimes deep frying it (understanding that is not recommend way of cooking) Snack: Beverages: alkaline water, broth   Post-Op Goals/ Signs/ Symptoms Using straws: no Drinking while eating: no Chewing/swallowing difficulties: no Changes in vision: no Changes to mood/headaches: no Hair loss/changes to skin/nails: no Difficulty focusing/concentrating: no Sweating: no Dizziness/lightheadedness: no Palpitations: no  Carbonated/caffeinated beverages: no N/V/D/C/Gas: miralax Abdominal pain: no Dumping syndrome: no    NUTRITION DIAGNOSIS  Overweight/obesity ([place]-3.3) related to past poor dietary habits  and physical inactivity as evidenced by completed bariatric surgery and following dietary guidelines for continued weight loss and healthy nutrition status.     NUTRITION INTERVENTION Nutrition counseling (C-1) and education (E-2) to facilitate bariatric surgery goals, including: . Diet advancement to the next phase (phase 4) now including non starchy vegetables  . The importance of consuming adequate calories as well as certain nutrients daily due to the body's need for essential vitamins, minerals, and fats . The importance of daily physical activity and to reach a goal of at least 150 minutes of moderate to vigorous physical activity weekly (or as directed by their physician) due to benefits such as increased musculature and improved lab values . The importance of intuitive eating specifically learning hunger-satiety cues and understanding the importance of learning a new body Goals: -Set an alarm to remind you to eat --Continue to aim for a minimum of 64 fluid ounces 7 days a week with at least 30 ounces being plain water -Eat non-starchy vegetables 2 times a day 7 days a week -Start out with soft cooked vegetables today and tomorrow; if tolerated begin to eat raw vegetables or cooked including salads -Eat your 3 ounces of protein first then start in on your non-starchy vegetables; once you understand how much of your meal leads to satisfaction and not full while still eating 3 ounces of protein and non-starchy vegetables you  can eat them in any order  -Continue to aim for 30 minutes of activity at least 5 times a week -Do NOT cook with/add to your food: alfredo sauce, cheese sauce, barbeque sauce, ketchup, fat back, butter, bacon grease, grease, Crisco  -Try plant based proteins  Handouts Provided Include   Non starchy vegetables   Learning Style & Readiness for Change Teaching method utilized: Visual & Auditory  Demonstrated degree of understanding via: Teach Back  Barriers to  learning/adherence to lifestyle change: none identified   RD's Notes for Next Visit . Assess adherence to pt chosen goals   MONITORING & EVALUATION Dietary intake, weekly physical activity, body weight  Next Steps Patient is to follow-up 3 months

## 2020-01-24 DIAGNOSIS — Z1151 Encounter for screening for human papillomavirus (HPV): Secondary | ICD-10-CM | POA: Diagnosis not present

## 2020-01-24 DIAGNOSIS — N760 Acute vaginitis: Secondary | ICD-10-CM | POA: Diagnosis not present

## 2020-01-24 DIAGNOSIS — Z01419 Encounter for gynecological examination (general) (routine) without abnormal findings: Secondary | ICD-10-CM | POA: Diagnosis not present

## 2020-01-24 DIAGNOSIS — Z118 Encounter for screening for other infectious and parasitic diseases: Secondary | ICD-10-CM | POA: Diagnosis not present

## 2020-01-24 DIAGNOSIS — Z6841 Body Mass Index (BMI) 40.0 and over, adult: Secondary | ICD-10-CM | POA: Diagnosis not present

## 2020-01-24 DIAGNOSIS — Z30431 Encounter for routine checking of intrauterine contraceptive device: Secondary | ICD-10-CM | POA: Diagnosis not present

## 2020-01-24 MED FILL — metroNIDAZOLE 0.75 % GEL: 0.75 | 5 days supply | Qty: 70 | Fill #0

## 2020-04-20 ENCOUNTER — Other Ambulatory Visit: Payer: Self-pay

## 2020-04-20 ENCOUNTER — Encounter: Payer: 59 | Attending: General Surgery | Admitting: Skilled Nursing Facility1

## 2020-04-20 DIAGNOSIS — E669 Obesity, unspecified: Secondary | ICD-10-CM | POA: Diagnosis not present

## 2020-04-20 NOTE — Progress Notes (Signed)
Bariatric Nutrition Follow-Up Visit Medical Nutrition Therapy  NUTRITION ASSESSMENT    Anthropometrics  Surgery date: 11/26/2019 Surgery type: sleeve Start weight at [facility]: 281 lbs Weight today: 230.8 lbs   Body Composition Scale Date 12/10/2019 01/21/2020 04/20/2020  Total Body Fat % 44.8 43.3 41.5  Visceral Fat 13 12 11   Fat-Free Mass % 55.1 56.6 58.4   Total Body Water % 42.0 42.8 43.7   Muscle-Mass lbs 33.9 33.7 33.6  Body Fat Displacement            Torso  lbs 72.8 66.1 59.4         Left Leg  lbs 14.5 13.2 11.8         Right Leg  lbs 14.5 13.2 11.8         Left Arm  lbs 7.2 6.6 5.9         Right Arm   lbs 7.2 6.6 5.9     Clinical  Medical hx: GERD, HTN Medications:  Labs:   Lifestyle & Dietary Hx  Pt arrived with her daughter.  Pt states she loves BELT. Pt state she is working on identify   Estimated daily fluid intake: 66+ oz Estimated daily protein intake: 60 g Supplements: multi and calcium  Current average weekly physical activity: walking on breaks from work  24-Hr Dietary Recall First Meal: protein shake Snack: decaff coffee with sugar free creamer or protein water Second Meal 11-12: 1/2 can of soup Snack 2-3pm:  Third Meal 7pm: fish or shrimp sometimes deep frying it (understanding that is not recommend way of cooking) Snack: Beverages: alkaline water, broth   Post-Op Goals/ Signs/ Symptoms Using straws: no Drinking while eating: no Chewing/swallowing difficulties: no Changes in vision: no Changes to mood/headaches: no Hair loss/changes to skin/nails: no Difficulty focusing/concentrating: no Sweating: no Dizziness/lightheadedness: no Palpitations: no  Carbonated/caffeinated beverages: no N/V/D/C/Gas: miralax Abdominal pain: no Dumping syndrome: no    NUTRITION DIAGNOSIS  Overweight/obesity ([place]-3.3) related to past poor dietary habits and physical inactivity as evidenced by completed bariatric surgery and following dietary guidelines for  continued weight loss and healthy nutrition status.     NUTRITION INTERVENTION Nutrition counseling (C-1) and education (E-2) to facilitate bariatric surgery goals, including: . Diet advancement to the next phase (phase 4) now including starchy vegetables and oatmeal (no butter) . The importance of consuming adequate calories as well as certain nutrients daily due to the body's need for essential vitamins, minerals, and fats . The importance of daily physical activity and to reach a goal of at least 150 minutes of moderate to vigorous physical activity weekly (or as directed by their physician) due to benefits such as increased musculature and improved lab values . The importance of intuitive eating specifically learning hunger-satiety cues and understanding the importance of learning a new body   Handouts Provided Include   starchy vegetables   Learning Style & Readiness for Change Teaching method utilized: Visual & Auditory  Demonstrated degree of understanding via: Teach Back  Barriers to learning/adherence to lifestyle change: none identified   RD's Notes for Next Visit . Assess adherence to pt chosen goals   MONITORING & EVALUATION Dietary intake, weekly physical activity, body weight  Next Steps Patient is to follow-up

## 2020-04-28 MED FILL — valACYclovir HCL 1 GM TABS: 1 | 15 days supply | Qty: 30 | Fill #0

## 2020-05-07 DIAGNOSIS — N6320 Unspecified lump in the left breast, unspecified quadrant: Secondary | ICD-10-CM | POA: Diagnosis not present

## 2020-05-27 DIAGNOSIS — R928 Other abnormal and inconclusive findings on diagnostic imaging of breast: Secondary | ICD-10-CM | POA: Diagnosis not present

## 2020-07-16 ENCOUNTER — Ambulatory Visit: Payer: 59 | Admitting: Skilled Nursing Facility1

## 2020-10-20 DIAGNOSIS — Z13220 Encounter for screening for lipoid disorders: Secondary | ICD-10-CM | POA: Diagnosis not present

## 2020-10-20 DIAGNOSIS — Z0000 Encounter for general adult medical examination without abnormal findings: Secondary | ICD-10-CM | POA: Diagnosis not present

## 2020-10-20 DIAGNOSIS — E669 Obesity, unspecified: Secondary | ICD-10-CM | POA: Diagnosis not present

## 2020-10-20 DIAGNOSIS — Z9884 Bariatric surgery status: Secondary | ICD-10-CM | POA: Diagnosis not present

## 2020-10-20 DIAGNOSIS — I10 Essential (primary) hypertension: Secondary | ICD-10-CM | POA: Diagnosis not present

## 2020-10-20 DIAGNOSIS — N63 Unspecified lump in unspecified breast: Secondary | ICD-10-CM | POA: Diagnosis not present

## 2020-12-16 ENCOUNTER — Other Ambulatory Visit (HOSPITAL_COMMUNITY): Payer: Self-pay | Admitting: Internal Medicine

## 2020-12-16 ENCOUNTER — Ambulatory Visit: Payer: 59 | Attending: Internal Medicine

## 2020-12-16 DIAGNOSIS — Z23 Encounter for immunization: Secondary | ICD-10-CM

## 2020-12-16 NOTE — Progress Notes (Signed)
   Covid-19 Vaccination Clinic  Name:  Alexis Foley    MRN: [id number] DOB: 1986-09-25  12/16/2020  [name] was observed post Covid-19 immunization for 15 minutes without incident. She was provided with Vaccine Information Sheet and instruction to access the V-Safe system.   [name] was instructed to call 911 with any severe reactions post vaccine: Marland Kitchen Difficulty breathing  . Swelling of face and throat  . A fast heartbeat  . A bad rash all over body  . Dizziness and weakness   Immunizations Administered    Name Date Dose VIS Date Route   Pfizer COVID-19 Vaccine 12/16/2020  9:12 AM 0.3 mL 09/16/2020 Intramuscular   Manufacturer: [employer], [employer]   Lot: [id number]   [facility]: [address]-0

## 2021-06-12 IMAGING — DX DG CHEST 2V
2 series · 2 of 2 positions shown · non-contrast
Comparison: PA and lateral chest 04/30/2015.

CLINICAL DATA: Preoperative examination. Patient for bariatric
surgery.

EXAM:
CHEST - 2 VIEW

[chest pa]
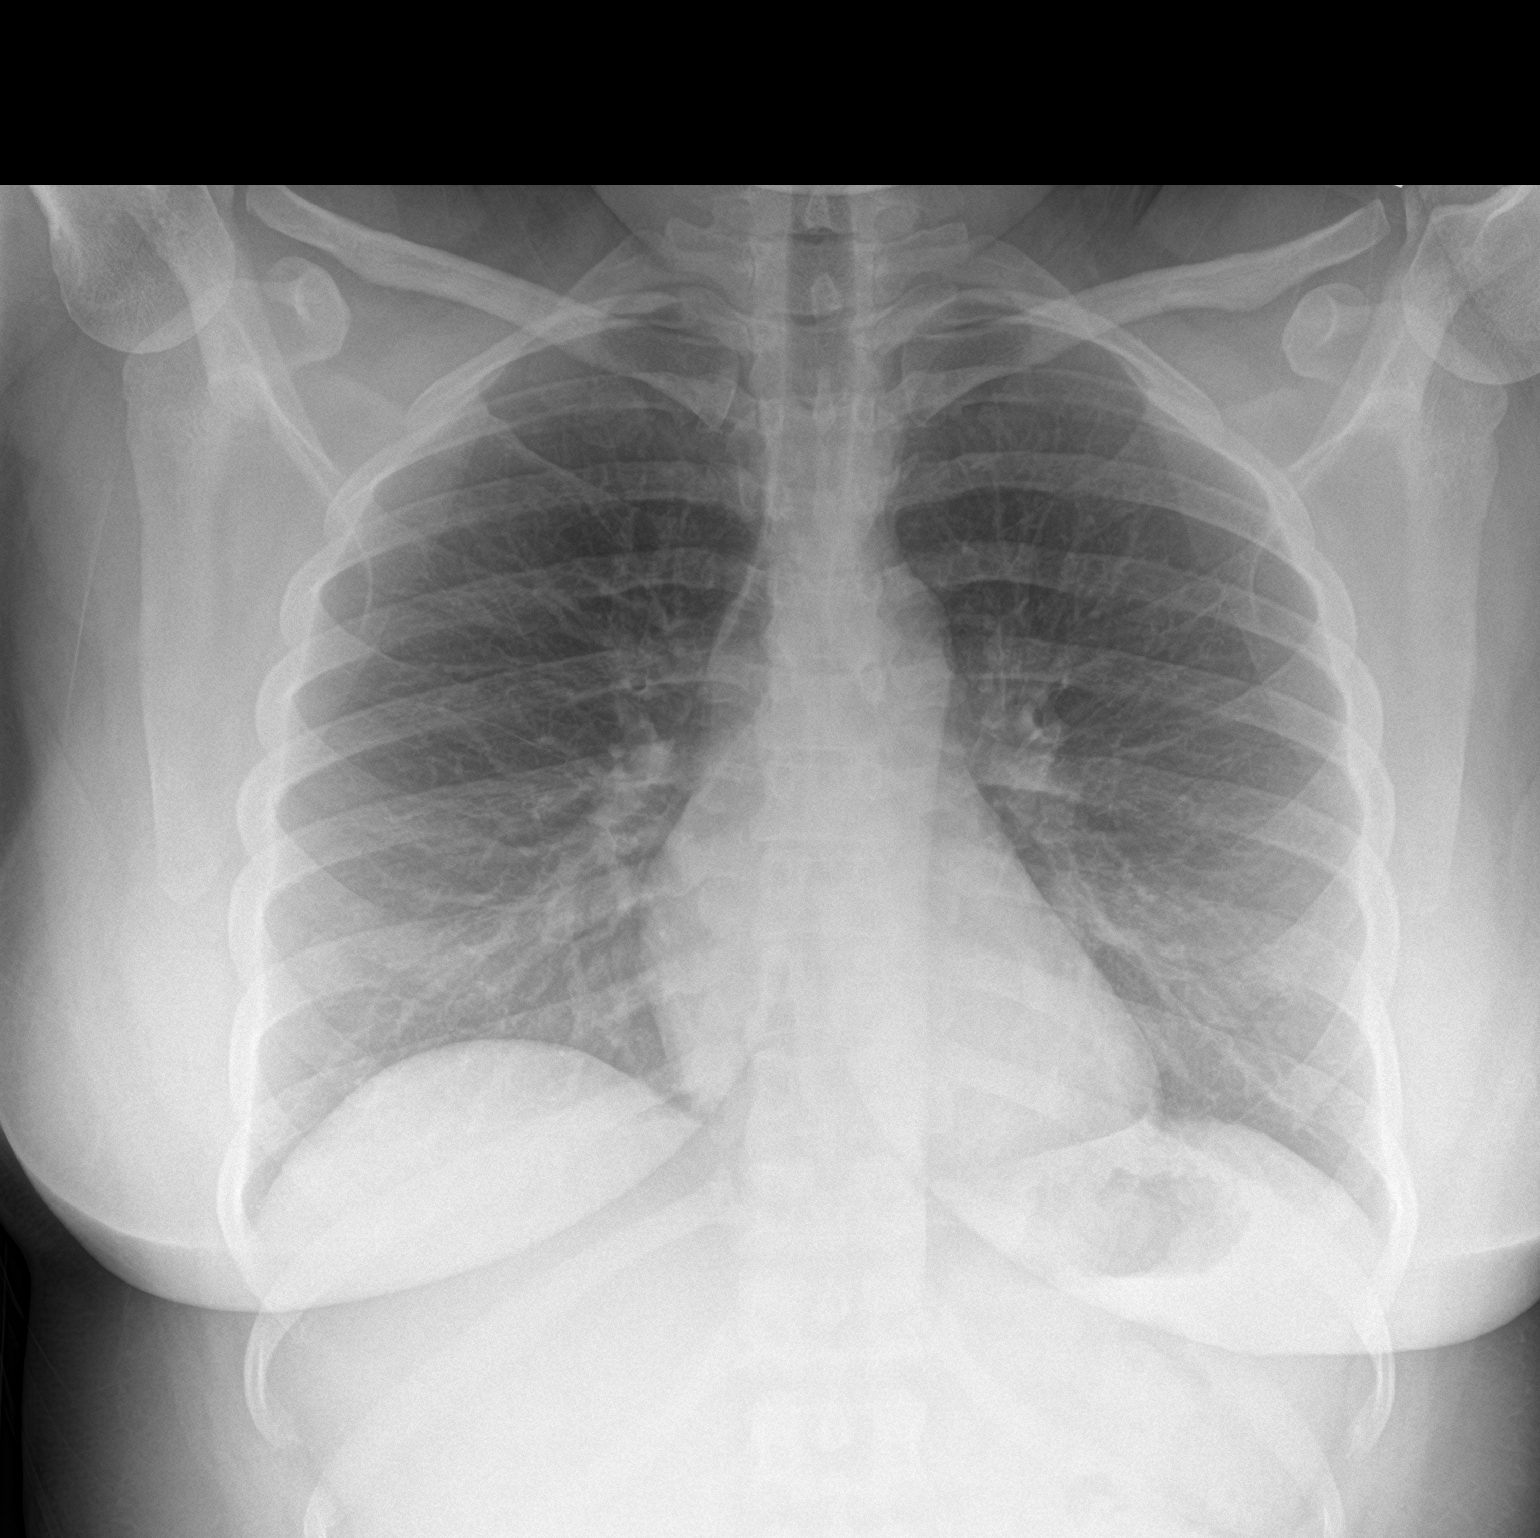

[chest lat]
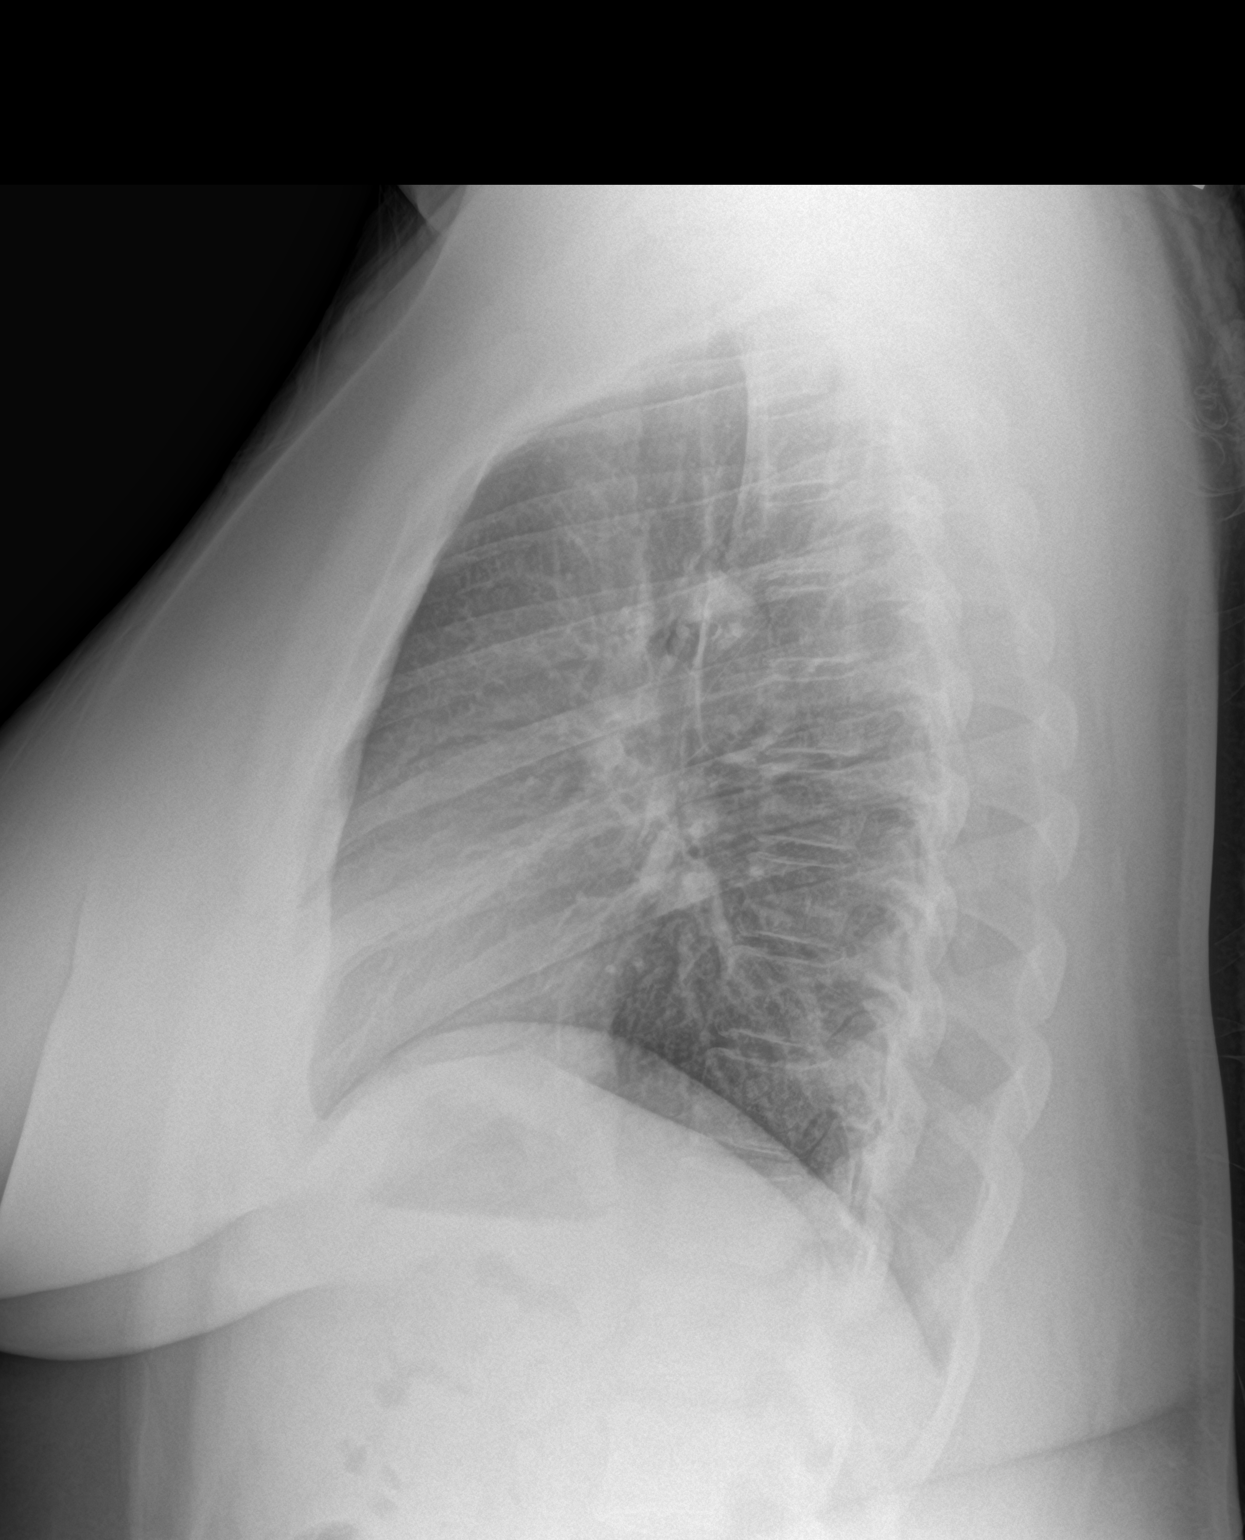

[2 of 2 positions shown; findings below may reference images not displayed]

FINDINGS: Lungs are clear. Heart size is normal. No pneumothorax or pleural
fluid. No acute or focal bony abnormality.
IMPRESSION: Negative chest.

## 2021-06-15 ENCOUNTER — Encounter (HOSPITAL_COMMUNITY): Payer: Self-pay | Admitting: *Deleted

## 2021-07-02 DIAGNOSIS — Z03818 Encounter for observation for suspected exposure to other biological agents ruled out: Secondary | ICD-10-CM | POA: Diagnosis not present

## 2021-09-06 ENCOUNTER — Ambulatory Visit: Payer: 59

## 2021-11-02 ENCOUNTER — Other Ambulatory Visit (HOSPITAL_COMMUNITY): Payer: Self-pay

## 2021-11-02 DIAGNOSIS — Z0000 Encounter for general adult medical examination without abnormal findings: Secondary | ICD-10-CM | POA: Diagnosis not present

## 2021-11-02 DIAGNOSIS — F411 Generalized anxiety disorder: Secondary | ICD-10-CM | POA: Diagnosis not present

## 2021-11-02 DIAGNOSIS — Z13220 Encounter for screening for lipoid disorders: Secondary | ICD-10-CM | POA: Diagnosis not present

## 2021-11-02 DIAGNOSIS — Z9884 Bariatric surgery status: Secondary | ICD-10-CM | POA: Diagnosis not present

## 2021-11-02 DIAGNOSIS — B009 Herpesviral infection, unspecified: Secondary | ICD-10-CM | POA: Diagnosis not present

## 2021-11-02 DIAGNOSIS — E669 Obesity, unspecified: Secondary | ICD-10-CM | POA: Diagnosis not present

## 2021-11-02 MED ORDER — ESCITALOPRAM OXALATE 10 MG PO TABS
10.0000 mg | ORAL_TABLET | Freq: Every day | ORAL | 12 refills | Status: AC
  Filled 2021-11-02: qty 30, 30d supply, fill #0

## 2021-11-23 DIAGNOSIS — Z124 Encounter for screening for malignant neoplasm of cervix: Secondary | ICD-10-CM | POA: Diagnosis not present

## 2021-11-23 DIAGNOSIS — R32 Unspecified urinary incontinence: Secondary | ICD-10-CM | POA: Diagnosis not present

## 2021-11-23 DIAGNOSIS — Z6837 Body mass index (BMI) 37.0-37.9, adult: Secondary | ICD-10-CM | POA: Diagnosis not present

## 2021-11-23 DIAGNOSIS — Z01411 Encounter for gynecological examination (general) (routine) with abnormal findings: Secondary | ICD-10-CM | POA: Diagnosis not present

## 2021-11-23 DIAGNOSIS — Z113 Encounter for screening for infections with a predominantly sexual mode of transmission: Secondary | ICD-10-CM | POA: Diagnosis not present

## 2021-11-23 DIAGNOSIS — Z01419 Encounter for gynecological examination (general) (routine) without abnormal findings: Secondary | ICD-10-CM | POA: Diagnosis not present

## 2021-12-15 DIAGNOSIS — R921 Mammographic calcification found on diagnostic imaging of breast: Secondary | ICD-10-CM | POA: Diagnosis not present

## 2021-12-15 DIAGNOSIS — N6321 Unspecified lump in the left breast, upper outer quadrant: Secondary | ICD-10-CM | POA: Diagnosis not present

## 2021-12-22 ENCOUNTER — Encounter: Payer: Self-pay | Admitting: Physical Therapy

## 2021-12-22 ENCOUNTER — Ambulatory Visit: Payer: 59 | Attending: Obstetrics and Gynecology | Admitting: Physical Therapy

## 2021-12-22 ENCOUNTER — Other Ambulatory Visit: Payer: Self-pay

## 2021-12-22 DIAGNOSIS — M6281 Muscle weakness (generalized): Secondary | ICD-10-CM | POA: Diagnosis not present

## 2021-12-22 DIAGNOSIS — R279 Unspecified lack of coordination: Secondary | ICD-10-CM | POA: Diagnosis not present

## 2021-12-22 DIAGNOSIS — R32 Unspecified urinary incontinence: Secondary | ICD-10-CM | POA: Insufficient documentation

## 2021-12-22 NOTE — Therapy (Signed)
[facility] @ [place], [place], [address] Phone: [phone]   Fax:  [phone]  Physical Therapy Evaluation  Patient Details  Name: Alexis Foley MRN: [id number] Date of Birth: 02/22/1986 Referring Provider (PT): Armandina Stammer, DO   Encounter Date: 12/22/2021   PT End of Session - 12/22/21 1720     Visit Number 1    Date for PT Re-Evaluation 03/16/22    Authorization Type [place]    PT Start Time 1628   late   PT Stop Time 1702    PT Time Calculation (min) 34 min    Activity Tolerance Patient tolerated treatment well    Behavior During Therapy [facility] for tasks assessed/performed             Past Medical History:  Diagnosis Date   Hypertension    under control; has been on med. x 2 yrs.   Macromastia 12/2011    Past Surgical History:  Procedure Laterality Date   BREAST REDUCTION SURGERY  01/17/2012   Procedure: MAMMARY REDUCTION BILATERAL (BREAST);  Surgeon: Charlene Brooke, MD;  Location: [facility];  Service: Plastics;  Laterality: Bilateral;   BREATH TEK H PYLORI N/A 04/30/2015   Procedure: BREATH TEK H PYLORI;  Surgeon: Johnathan Hausen, MD;  Location: [facility];  Service: General;  Laterality: N/A;   LAPAROSCOPIC GASTRIC SLEEVE RESECTION N/A 11/26/2019   Procedure: LAPAROSCOPIC GASTRIC SLEEVE RESECTION, UPPER ENDO, ERAS Pathway;  Surgeon: Greer Pickerel, MD;  Location: [facility];  Service: General;  Laterality: N/A;    There were no vitals filed for this visit.    Subjective Assessment - 12/22/21 1631     Subjective I have SUI sneezing, coughing, laughing, jumping and it is getting worse.  Wets underwear and pants.    Patient Stated Goals stop leaking                [facility] PT Assessment - 12/22/21 0001       Assessment   Medical Diagnosis R32 (ICD-10-CM) - Unspecified urinary incontinence    Referring Provider (PT) Law, Cassandra A, DO      Precautions    Precautions None      Balance Screen   Has the patient fallen in the past 6 months No      [place] residence    Living Arrangements Spouse/significant other;Children      Prior Function   Level of Independence Independent    Vocation Full time employment    Vocation Requirements sitting - work at home      Cognition   Overall Cognitive Status Within Functional Limits for tasks assessed      Functional Tests   Functional tests Single leg stance      Single Leg Stance   Comments normal      Posture/Postural Control   Posture/Postural Control Postural limitations    Postural Limitations Anterior pelvic tilt    Posture Comments lean to the Rt and tighter on Rt side      ROM / Strength   AROM / PROM / Strength AROM;PROM;Strength      AROM   Overall AROM Comments lumbar normal flexion, left SB and rotation 75%      PROM   Overall PROM Comments hip flexion 80% bil      Strength   Overall Strength Comments bil hip 5/5      Flexibility   Soft Tissue  Assessment /Muscle Length yes    Hamstrings 75% bil      Ambulation/Gait   Gait Pattern Within Functional Limits                        Objective measurements completed on examination: See above findings.     Pelvic Floor Special Questions - 12/22/21 0001     Are you Pregnant or attempting pregnancy? No    Prior Pregnancies Yes    Number of Pregnancies 2    Number of Vaginal Deliveries 2    Any difficulty with labor and deliveries --   episiotomy   Currently Sexually Active Yes    Is this Painful No    Urinary Leakage Yes    How often depends on the activities    Urinary urgency Yes   not every time   Urinary frequency normal    Fecal incontinence --   constipation - had gasric sleeve - uses mirilax feels incomplete   Fluid intake at least 3-4 bottles    Falling out feeling (prolapse) No    Perineal Body/Introitus  Descended    Prolapse --   mild urethra    Pelvic Floor Internal Exam pt identity confirmed and informed consent given to perform internal STM    Exam Type Vaginal    Palpation 5/5 on Rt side; unable to engage strong contraction with breathing, inhaling and holding breath    Strength good squeeze, good lift, able to hold agaisnt strong resistance    Strength # of reps 8    Strength # of seconds 15              [facility] Adult PT Treatment/Exercise - 12/22/21 0001       Self-Care   Self-Care Other Self-Care Comments    Other Self-Care Comments  exhale with kegel and knack                     PT Education - 12/22/21 1721     Education Details exhale with kegel and knack    Person(s) Educated Patient    Methods Explanation;Demonstration;Verbal cues    Comprehension Verbalized understanding;Returned demonstration                 PT Long Term Goals - 12/22/21 1718       PT LONG TERM GOAL #1   Title pt will be able to participate in zumba without leakage    Time 12    Period Weeks    Status New    Target Date 03/16/22      PT LONG TERM GOAL #2   Title pt will be ind with advanced HEP    Time 12    Period Weeks    Status New    Target Date 03/16/22      PT LONG TERM GOAL #3   Title Pt will be able to cough or sneeze without leakage    Time 12    Period Weeks    Status New    Target Date 03/16/22      PT LONG TERM GOAL #4   Title Pt will be able to sustain pelvic floor contraction for 20 seconds without holding her breath    Time 12    Period Weeks    Status New    Target Date 03/16/22                    Plan -  12/22/21 1709     Clinical Impression Statement Pt presents to skilled PT due to SUI that has been worsening.  Pt has 4/5 MMT of pelvic floor and can do 8 quick flicks but inhales every time. She is able to hold 15 sec but also holding her breath.  Pt has good pelvic strength but poor coordination and unable to cough while engageing the pelvic floor.  Pt will benefit from  skilled PT to work on improved diaphragm, core, andpelvic muscle coordination for improved posture and pressure management so she will be able to return to more active lifestyle without bladder leakage.    Personal Factors and Comorbidities Comorbidity 2    Comorbidities stomach sleeve; significant tearing during vaginal delivery    Examination-Activity Limitations Continence;Lift;Toileting;Other   exercise   Examination-Participation Restrictions Community Activity    Stability/Clinical Decision Making Evolving/Moderate complexity    Clinical Decision Making Moderate    Rehab Potential Excellent    PT Frequency 1x / week    PT Duration 12 weeks    PT Treatment/Interventions ADLs/Self Care Home Management;Biofeedback;Cryotherapy;Electrical Stimulation;Moist Heat;Therapeutic activities;Therapeutic exercise;Neuromuscular re-education;Taping;Dry needling;Passive range of motion;Manual techniques;Patient/family education    PT Next Visit Plan f/u on exhale with exertion, knack and progress as tolerated, review toileting techniques    PT [place] next - add h/s stretch, piriformis stretch    Consulted and Agree with Plan of Care Patient             Patient will benefit from skilled therapeutic intervention in order to improve the following deficits and impairments:  Postural dysfunction, Impaired flexibility, Increased fascial restricitons, Decreased strength, Decreased coordination, Decreased endurance  Visit Diagnosis: Unspecified lack of coordination  Muscle weakness (generalized)     Problem List Patient Active Problem List   Diagnosis Date Noted   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

## 2021-12-30 DIAGNOSIS — H0102A Squamous blepharitis right eye, upper and lower eyelids: Secondary | ICD-10-CM | POA: Diagnosis not present

## 2021-12-30 DIAGNOSIS — H10413 Chronic giant papillary conjunctivitis, bilateral: Secondary | ICD-10-CM | POA: Diagnosis not present

## 2021-12-30 DIAGNOSIS — H5213 Myopia, bilateral: Secondary | ICD-10-CM | POA: Diagnosis not present

## 2021-12-30 DIAGNOSIS — H35413 Lattice degeneration of retina, bilateral: Secondary | ICD-10-CM | POA: Diagnosis not present

## 2021-12-30 DIAGNOSIS — H0102B Squamous blepharitis left eye, upper and lower eyelids: Secondary | ICD-10-CM | POA: Diagnosis not present

## 2022-01-24 ENCOUNTER — Ambulatory Visit: Payer: 59 | Attending: Obstetrics and Gynecology | Admitting: Physical Therapy

## 2022-01-24 ENCOUNTER — Other Ambulatory Visit: Payer: Self-pay

## 2022-01-24 ENCOUNTER — Encounter: Payer: Self-pay | Admitting: Physical Therapy

## 2022-01-24 DIAGNOSIS — R279 Unspecified lack of coordination: Secondary | ICD-10-CM | POA: Insufficient documentation

## 2022-01-24 DIAGNOSIS — M6281 Muscle weakness (generalized): Secondary | ICD-10-CM | POA: Diagnosis not present

## 2022-01-24 NOTE — Patient Instructions (Signed)
Access Code: [email or web address] URL: [email or web address] Date: 01/24/2022 Prepared by: Jari Favre  Exercises Ball squeeze with Kegel - 3 x daily - 7 x weekly - 1 sets - 10 reps - 3 sec hold Standing Low Back Flexion at Table - 3 x daily - 7 x weekly - 1 sets - 10 reps Supine Hamstring Stretch - 1 x daily - 7 x weekly - 1 sets - 3 reps - 30 sec hold Supine Piriformis Stretch with Foot on Ground - 1 x daily - 7 x weekly - 1 sets - 3 reps - 30 sec hold Supine Hip Internal and External Rotation - 1 x daily - 7 x weekly - 1 sets - 10 reps - 5 sec hold

## 2022-01-24 NOTE — Therapy (Signed)
[facility] @ [facility] [address] [place], [place], [address] Phone: [phone]   Fax:  [phone]  Physical Therapy Treatment  Patient Details  Name: Alexis Foley MRN: [id number] Date of Birth: 1986/03/28 Referring Provider (PT): Toy Baker, DO   Encounter Date: 01/24/2022   PT End of Session - 01/24/22 1617     Visit Number 2    Date for PT Re-Evaluation 03/16/22    Authorization Type [place]    PT Start Time 1615    PT Stop Time 1653    PT Time Calculation (min) 38 min    Activity Tolerance Patient tolerated treatment well    Behavior During Therapy [facility] for tasks assessed/performed             Past Medical History:  Diagnosis Date   Hypertension    under control; has been on med. x 2 yrs.   Macromastia 12/2011    Past Surgical History:  Procedure Laterality Date   BREAST REDUCTION SURGERY  01/17/2012   Procedure: MAMMARY REDUCTION BILATERAL (BREAST);  Surgeon: Karie Fetch, MD;  Location: [facility];  Service: Plastics;  Laterality: Bilateral;   BREATH TEK H PYLORI N/A 04/30/2015   Procedure: BREATH TEK H PYLORI;  Surgeon: Luretha Murphy, MD;  Location: [facility];  Service: General;  Laterality: N/A;   LAPAROSCOPIC GASTRIC SLEEVE RESECTION N/A 11/26/2019   Procedure: LAPAROSCOPIC GASTRIC SLEEVE RESECTION, UPPER ENDO, ERAS Pathway;  Surgeon: Gaynelle Adu, MD;  Location: [facility];  Service: General;  Laterality: N/A;    There were no vitals filed for this visit.   Subjective Assessment - 01/24/22 1619     Subjective I don't notice any changes but I am able to notice when I am tightening the muscles and exhaling    Patient Stated Goals stop leaking    Currently in Pain? No/denies                               [facility] Adult PT Treatment/Exercise - 01/24/22 0001       Self-Care   Other Self-Care Comments  exhale with kegel in supine; kegel and holding 50% in  standing leaning on table;      Neuro Re-ed    Neuro Re-ed Details  educated and performed kegels and breathing correctly      Exercises   Exercises Lumbar      Lumbar Exercises: Stretches   Active Hamstring Stretch Right;Left;2 reps;20 seconds    Double Knee to Chest Stretch 3 reps;10 seconds    Lower Trunk Rotation 3 reps;10 seconds    Lower Trunk Rotation Limitations hip rotation variation      Lumbar Exercises: Standing   Other Standing Lumbar Exercises leaning on mat - kegel with breathing holding 10 sec; foot position variations      Lumbar Exercises: Supine   Other Supine Lumbar Exercises breathing into pelvic floor with yoga block                     PT Education - 01/24/22 1721     Education Details Access Code: [email or web address]    Person(s) Educated Patient    Methods Explanation;Demonstration;Tactile cues;Verbal cues;Handout    Comprehension Verbalized understanding;Returned demonstration                 PT Long Term Goals - 12/22/21 1718       PT  LONG TERM GOAL #1   Title pt will be able to participate in zumba without leakage    Time 12    Period Weeks    Status New    Target Date 03/16/22      PT LONG TERM GOAL #2   Title pt will be ind with advanced HEP    Time 12    Period Weeks    Status New    Target Date 03/16/22      PT LONG TERM GOAL #3   Title Pt will be able to cough or sneeze without leakage    Time 12    Period Weeks    Status New    Target Date 03/16/22      PT LONG TERM GOAL #4   Title Pt will be able to sustain pelvic floor contraction for 20 seconds without holding her breath    Time 12    Period Weeks    Status New    Target Date 03/16/22                   Plan - 01/24/22 1712     Clinical Impression Statement Pt is doing much better with coordinating breathing and was able to do 10 sec hold while engaging the pelvic floor at 50%.  Pt was educated in how to imporve BMs today to ensure least amount of  pressure in pelvic floor and on bladder.  Pt is ind with initial HEP.  Pt will benefit from skilled PT to continue to address strength and endurance.    PT Treatment/Interventions ADLs/Self Care Home Management;Biofeedback;Cryotherapy;Electrical Stimulation;Moist Heat;Therapeutic activities;Therapeutic exercise;Neuromuscular re-education;Taping;Dry needling;Passive range of motion;Manual techniques;Patient/family education    PT Next Visit Plan bridge with ball kick and reverse qped with ball; qped series; side lunge or step; squat with band lift in half kneel    PT Home Exercise Plan Access Code: [email or web address]  URL: [email or web address]  Date: 01/24/2022  Prepared by: Dwana Curd    Exercises  Ball squeeze with Kegel - 3 x daily - 7 x weekly - 1 sets - 10 reps - 3 sec hold  Standing Low Back Flexion at Table - 3 x daily - 7 x weekly - 1 sets - 10 reps  Supine Hamstring Stretch - 1 x daily - 7 x weekly - 1 sets - 3 reps - 30 sec hold  Supine Piriformis Stretch with Foot on Ground - 1 x daily - 7 x weekly - 1 sets - 3 reps - 30 sec hold  Supine Hip Internal and External Rotation - 1 x daily - 7 x weekly - 1 sets - 10 reps - 5 sec hold    Consulted and Agree with Plan of Care Patient             Patient will benefit from skilled therapeutic intervention in order to improve the following deficits and impairments:  Postural dysfunction, Impaired flexibility, Increased fascial restricitons, Decreased strength, Decreased coordination, Decreased endurance  Visit Diagnosis: Unspecified lack of coordination  Muscle weakness (generalized)     Problem List Patient Active Problem List   Diagnosis Date Noted   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 [place], [place], [address] Phone: [phone]   Fax:  [phone]  Name: ABY GESSEL MRN: [id number] Date of Birth: 1986-05-25

## 2022-01-31 ENCOUNTER — Ambulatory Visit: Payer: 59 | Attending: Obstetrics and Gynecology | Admitting: Physical Therapy

## 2022-01-31 ENCOUNTER — Encounter: Payer: Self-pay | Admitting: Physical Therapy

## 2022-01-31 ENCOUNTER — Other Ambulatory Visit: Payer: Self-pay

## 2022-01-31 DIAGNOSIS — M6281 Muscle weakness (generalized): Secondary | ICD-10-CM | POA: Diagnosis not present

## 2022-01-31 DIAGNOSIS — R279 Unspecified lack of coordination: Secondary | ICD-10-CM | POA: Diagnosis not present

## 2022-01-31 NOTE — Therapy (Signed)
[place] ?[facility] @ [place] ?[place], [place], [address] ?Phone: [phone]   Fax:  [phone] ? ?Physical Therapy Treatment ? ?Patient Details  ?Name: Alexis Foley ?MRN: [id number] ?Date of Birth: 1986/01/12 ?Referring Provider (PT): Law, Cassandra A, DO ? ? ?Encounter Date: 01/31/2022 ? ? PT End of Session - 01/31/22 1620   ? ? Visit Number 3   ? Date for PT Re-Evaluation 03/16/22   ? Authorization Type    ? PT Start Time [id number]   ? PT Stop Time 1700   ? PT Time Calculation (min) 43 min   ? Activity Tolerance Patient tolerated treatment well   ? Behavior During Therapy [facility] for tasks assessed/performed   ? ?  ?  ? ?  ? ? ?Past Medical History:  ?Diagnosis Date  ? Hypertension   ? under control; has been on med. x 2 yrs.  ? Macromastia 12/2011  ? ? ?Past Surgical History:  ?Procedure Laterality Date  ? BREAST REDUCTION SURGERY  01/17/2012  ? Procedure: MAMMARY REDUCTION BILATERAL (BREAST);  Surgeon: Charlene Brooke, MD;  Location: [facility];  Service: Plastics;  Laterality: Bilateral;  ? BREATH TEK H PYLORI N/A 04/30/2015  ? Procedure: BREATH TEK H PYLORI;  Surgeon: Johnathan Hausen, MD;  Location: [facility];  Service: General;  Laterality: N/A;  ? LAPAROSCOPIC GASTRIC SLEEVE RESECTION N/A 11/26/2019  ? Procedure: LAPAROSCOPIC GASTRIC SLEEVE RESECTION, UPPER ENDO, ERAS Pathway;  Surgeon: Greer Pickerel, MD;  [facility];  Service: General;  Laterality: N/A;  ? ? ?There were no vitals filed for this visit. ? ? Subjective Assessment - 01/31/22 1622   ? ? Subjective I had one day I sneezed and I dribbled a little and I think it was better.   I got the squatty potty and that helped.   ? Patient Stated Goals stop leaking   ? Currently in Pain? No/denies   ? ?  ?  ? ?  ? ? ? ? ? ? ? ? ? ? ? ? ? ? ? ? ? ? ? ? [facility] Adult PT Treatment/Exercise - 01/31/22 0001   ? ?  ? Neuro Re-ed   ? Neuro Re-ed Details  cues to do diaphragmatic breahting  correctly and exhale with exertion   ?  ? Lumbar Exercises: Standing  ? Side Lunge 10 reps   ? Other Standing Lumbar Exercises walking isometric core with blue band   ? Other Standing Lumbar Exercises half kneeling rotation with blue band   ?  ? Lumbar Exercises: Supine  ? Bridge with March 5 reps   ? Bridge with [employer] Limitations yoga block btwn knees   ? Other Supine Lumbar Exercises breathing into pelvic floor with yoga block   ? Other Supine Lumbar Exercises alt UE/LE presses in supine - 10x   ?  ? Lumbar Exercises: Quadruped  ? Other Quadruped Lumbar Exercises reverse qped with block btwn knees - 12x   ? ?  ?  ? ?  ? ? ? ? ? ? ? ? ? ? PT Education - 01/31/22 1701   ? ? Education Details Access Code: [email or web address]   ? Person(s) Educated Patient   ? Methods Explanation;Demonstration;Tactile cues;Handout;Verbal cues   ? Comprehension Verbalized understanding;Returned demonstration   ? ?  ?  ? ?  ? ? ? ? ? ? PT Long Term Goals - 01/31/22 1703   ? ?  ? PT LONG TERM  GOAL #1  ? Title pt will be able to participate in zumba without leakage   ? Status On-going   ?  ? PT LONG TERM GOAL #2  ? Title pt will be ind with advanced HEP   ? Status On-going   ?  ? PT LONG TERM GOAL #3  ? Title Pt will be able to cough or sneeze without leakage   ? Baseline better   ? Status On-going   ?  ? PT LONG TERM GOAL #4  ? Title Pt will be able to sustain pelvic floor contraction for 20 seconds without holding her breath   ? Status On-going   ? ?  ?  ? ?  ? ? ? ? ? ? ? ? Plan - 01/31/22 1701   ? ? Clinical Impression Statement Pt was able to progress exercise difficulty today.  Pt doing standing exercises and is looking much better with breathing technique. pt states leakage was a little better.  She is still not doing zumba so unknown results towards that goal.  pt will benefit from skilled PT to cointiue to address strength and coordination for reduced leakage.   ? PT Treatment/Interventions ADLs/Self Care Home  Management;Biofeedback;Cryotherapy;Electrical Stimulation;Moist Heat;Therapeutic activities;Therapeutic exercise;Neuromuscular re-education;Taping;Dry needling;Passive range of motion;Manual techniques;Patient/family education   ? PT Next Visit Plan f/u on side lunge and add squat, half kneel, qped, dead lift   ? PT Home Exercise Plan Access Code: [email or web address]   ? Consulted and Agree with Plan of Care Patient   ? ?  ?  ? ?  ? ? ?Patient will benefit from skilled therapeutic intervention in order to improve the following deficits and impairments:  Postural dysfunction, Impaired flexibility, Increased fascial restricitons, Decreased strength, Decreased coordination, Decreased endurance ? ?Visit Diagnosis: ?Unspecified lack of coordination ? ?Muscle weakness (generalized) ? ? ? ? ?Problem List ?Patient Active Problem List  ? Diagnosis Date Noted  ? 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 Chronic low back pain without sciatica 02/28/2011  ? Neck sprain and strain 02/17/2011  ? Concussion 02/17/2011  ? Lumbar strain 02/17/2011  ? HYPERTENSION, BENIGN 10/08/2010  ? GERD 01/14/2009  ? Severe obesity ([place]) 11/07/2007  ? ? ?Jule Ser, PT ?01/31/2022, 5:04 PM ? ?[place] ?[facility] [place] @ [place] ?[place], [place], [address] ?Phone: [phone]   Fax:  [phone] ? ?Name: Alexis Foley ?MRN: [id number] ?Date of Birth: May 05, 1986 ? ? ? ?

## 2022-01-31 NOTE — Patient Instructions (Signed)
Access Code: [email or web address] ?URL: [email or web address] ?Date: 01/31/2022 ?Prepared by: Dwana Curd ? ?Exercises ?Ball squeeze with Kegel - 3 x daily - 7 x weekly - 1 sets - 10 reps - 3 sec hold ?Standing Low Back Flexion at Table - 3 x daily - 7 x weekly - 1 sets - 10 reps ?Supine Hamstring Stretch - 1 x daily - 7 x weekly - 1 sets - 3 reps - 30 sec hold ?Supine Piriformis Stretch with Foot on Ground - 1 x daily - 7 x weekly - 1 sets - 3 reps - 30 sec hold ?Supine Hip Internal and External Rotation - 1 x daily - 7 x weekly - 1 sets - 10 reps - 5 sec hold ?Supine Bridge with Mini Swiss Ball Between Knees - 1 x daily - 7 x weekly - 2 sets - 10 reps ?Standing Shoulder Flexion Reactive Isometrics with Elbow Extended - 1 x daily - 7 x weekly - 3 sets - 10 reps ?Upright Side Lunge - 1 x daily - 7 x weekly - 2 sets - 10 reps ? ?

## 2022-02-07 ENCOUNTER — Encounter: Payer: Self-pay | Admitting: Physical Therapy

## 2022-02-07 ENCOUNTER — Other Ambulatory Visit: Payer: Self-pay

## 2022-02-07 ENCOUNTER — Ambulatory Visit: Payer: 59 | Admitting: Physical Therapy

## 2022-02-07 DIAGNOSIS — R279 Unspecified lack of coordination: Secondary | ICD-10-CM | POA: Diagnosis not present

## 2022-02-07 DIAGNOSIS — M6281 Muscle weakness (generalized): Secondary | ICD-10-CM | POA: Diagnosis not present

## 2022-02-07 NOTE — Therapy (Signed)
[facility] [address] [place], [place], [address] Phone: [phone]   Fax:  [phone]  Physical Therapy Treatment  Patient Details  Name: Alexis Foley MRN: [id number] Date of Birth: [birth year] Referring Provider (PT): Toy Baker, DO   Encounter Date: 02/07/2022   PT End of Session - 02/07/22 1618     Visit Number 4    Date for PT Re-Evaluation 03/16/22    Authorization Type [place]    PT Start Time 1615    PT Stop Time 1653    PT Time Calculation (min) 38 min    Activity Tolerance Patient tolerated treatment well    Behavior During Therapy [facility] for tasks assessed/performed             Past Medical History:  Diagnosis Date   Hypertension    under control; has been on med. x 2 yrs.   Macromastia 12/2011    Past Surgical History:  Procedure Laterality Date   BREAST REDUCTION SURGERY  01/17/2012   Procedure: MAMMARY REDUCTION BILATERAL (BREAST);  Surgeon: Karie Fetch, MD;  Location: [facility];  Service: Plastics;  Laterality: Bilateral;   BREATH TEK H PYLORI N/A 04/30/2015   Procedure: BREATH TEK H PYLORI;  Surgeon: Luretha Murphy, MD;  Location: [facility];  Service: General;  Laterality: N/A;   LAPAROSCOPIC GASTRIC SLEEVE RESECTION N/A 11/26/2019   Procedure: LAPAROSCOPIC GASTRIC SLEEVE RESECTION, UPPER ENDO, ERAS Pathway;  Surgeon: Gaynelle Adu, MD;  Location: [facility];  Service: General;  Laterality: N/A;    There were no vitals filed for this visit.   Subjective Assessment - 02/07/22 1623     Subjective I felt like I was doing better this weekend.  I am a good 30% better.    Patient Stated Goals stop leaking    Currently in Pain? No/denies                               [facility] Adult PT Treatment/Exercise - 02/07/22 0001       Lumbar Exercises: Stretches   Hip Flexor Stretch Right;Left;2 reps;20 seconds      Lumbar Exercises: Aerobic   Nustep  3/3 fwd/ back working on endurance of the pelvic floor      Lumbar Exercises: Standing   Side Lunge 20 reps    Other Standing Lumbar Exercises walking side stepping yellow loop;    Other Standing Lumbar Exercises half kneeling rotation with blue ball            hopping side diagonal - 2 min Mini jump squat - 8x Qped alt UE/LE - 10x              PT Long Term Goals - 02/07/22 1628       PT LONG TERM GOAL #1   Title pt will be able to participate in zumba without leakage    Status On-going      PT LONG TERM GOAL #2   Title pt will be ind with advanced HEP    Status On-going      PT LONG TERM GOAL #3   Title Pt will be able to cough or sneeze without leakage    Baseline didn't have any leakage all week, not sure if I coughed or sneezed    Status On-going      PT LONG TERM GOAL #4   Title Pt will be  able to sustain pelvic floor contraction for 20 seconds without holding her breath    Status On-going                   Plan - 02/07/22 1625     Clinical Impression Statement Pt continues to make improvements in strength and is able to progress exercise difficulty.  Pt is feeing a good 30% improved.  Pt will benefit from skilled PT to progress into adding more higher impact exercises to resume zumba.    PT Treatment/Interventions ADLs/Self Care Home Management;Biofeedback;Cryotherapy;Electrical Stimulation;Moist Heat;Therapeutic activities;Therapeutic exercise;Neuromuscular re-education;Taping;Dry needling;Passive range of motion;Manual techniques;Patient/family education    PT Next Visit Plan continue to progress adding some light hopping and jumping    PT Home Exercise Plan Access Code: [email or web address]    Consulted and Agree with Plan of Care Patient             Patient will benefit from skilled therapeutic intervention in order to improve the following deficits and impairments:  Postural dysfunction, Impaired flexibility, Increased fascial restricitons,  Decreased strength, Decreased coordination, Decreased endurance  Visit Diagnosis: Unspecified lack of coordination  Muscle weakness (generalized)     Problem List Patient Active Problem List   Diagnosis Date Noted   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 [place], [place], [address] Phone: [phone]   Fax:  [phone]  Name: Alexis Foley MRN: [id number] Date of Birth: 16-Sep-1986

## 2022-02-14 ENCOUNTER — Encounter: Payer: Self-pay | Admitting: Physical Therapy

## 2022-02-21 ENCOUNTER — Ambulatory Visit: Payer: 59 | Admitting: Physical Therapy

## 2022-02-28 ENCOUNTER — Encounter: Payer: Self-pay | Admitting: Physical Therapy

## 2022-06-24 ENCOUNTER — Encounter (HOSPITAL_COMMUNITY): Payer: Self-pay | Admitting: *Deleted

## 2022-11-07 ENCOUNTER — Other Ambulatory Visit (HOSPITAL_COMMUNITY): Payer: Self-pay

## 2022-11-07 DIAGNOSIS — F411 Generalized anxiety disorder: Secondary | ICD-10-CM | POA: Diagnosis not present

## 2022-11-07 DIAGNOSIS — R238 Other skin changes: Secondary | ICD-10-CM | POA: Diagnosis not present

## 2022-11-07 DIAGNOSIS — Z0000 Encounter for general adult medical examination without abnormal findings: Secondary | ICD-10-CM | POA: Diagnosis not present

## 2022-11-07 DIAGNOSIS — B009 Herpesviral infection, unspecified: Secondary | ICD-10-CM | POA: Diagnosis not present

## 2022-11-07 DIAGNOSIS — Z9884 Bariatric surgery status: Secondary | ICD-10-CM | POA: Diagnosis not present

## 2022-11-07 DIAGNOSIS — Z131 Encounter for screening for diabetes mellitus: Secondary | ICD-10-CM | POA: Diagnosis not present

## 2022-11-07 DIAGNOSIS — Z6835 Body mass index (BMI) 35.0-35.9, adult: Secondary | ICD-10-CM | POA: Diagnosis not present

## 2022-11-07 DIAGNOSIS — L304 Erythema intertrigo: Secondary | ICD-10-CM | POA: Diagnosis not present

## 2022-11-07 DIAGNOSIS — Z13220 Encounter for screening for lipoid disorders: Secondary | ICD-10-CM | POA: Diagnosis not present

## 2022-11-07 DIAGNOSIS — E669 Obesity, unspecified: Secondary | ICD-10-CM | POA: Diagnosis not present

## 2022-11-07 MED ORDER — KETOCONAZOLE 2 % EX CREA
TOPICAL_CREAM | Freq: Every day | CUTANEOUS | 0 refills | Status: AC
  Filled 2022-11-07: qty 15, 14d supply, fill #0

## 2022-11-07 MED ORDER — CLOBETASOL PROP EMOLLIENT BASE 0.05 % EX CREA
TOPICAL_CREAM | Freq: Two times a day (BID) | CUTANEOUS | 0 refills | Status: AC
  Filled 2022-11-07 (×2): qty 15, 10d supply, fill #0

## 2022-11-08 ENCOUNTER — Other Ambulatory Visit (HOSPITAL_COMMUNITY): Payer: Self-pay

## 2022-11-16 ENCOUNTER — Other Ambulatory Visit (HOSPITAL_COMMUNITY): Payer: Self-pay

## 2022-11-16 DIAGNOSIS — L218 Other seborrheic dermatitis: Secondary | ICD-10-CM | POA: Diagnosis not present

## 2022-11-16 MED ORDER — CLOBETASOL PROPIONATE 0.05 % EX SOLN
Freq: Two times a day (BID) | CUTANEOUS | 11 refills | Status: DC
  Filled 2022-11-16: qty 50, 30d supply, fill #0
  Filled 2023-01-16 – 2023-01-22 (×2): qty 50, 30d supply, fill #1
  Filled 2023-05-03: qty 50, 30d supply, fill #2

## 2022-12-07 ENCOUNTER — Other Ambulatory Visit (HOSPITAL_COMMUNITY): Payer: Self-pay

## 2022-12-07 DIAGNOSIS — Z01419 Encounter for gynecological examination (general) (routine) without abnormal findings: Secondary | ICD-10-CM | POA: Diagnosis not present

## 2022-12-07 DIAGNOSIS — Z01411 Encounter for gynecological examination (general) (routine) with abnormal findings: Secondary | ICD-10-CM | POA: Diagnosis not present

## 2022-12-07 DIAGNOSIS — Z114 Encounter for screening for human immunodeficiency virus [HIV]: Secondary | ICD-10-CM | POA: Diagnosis not present

## 2022-12-07 DIAGNOSIS — Z1159 Encounter for screening for other viral diseases: Secondary | ICD-10-CM | POA: Diagnosis not present

## 2022-12-07 DIAGNOSIS — Z113 Encounter for screening for infections with a predominantly sexual mode of transmission: Secondary | ICD-10-CM | POA: Diagnosis not present

## 2022-12-07 DIAGNOSIS — Z124 Encounter for screening for malignant neoplasm of cervix: Secondary | ICD-10-CM | POA: Diagnosis not present

## 2022-12-07 DIAGNOSIS — Z6836 Body mass index (BMI) 36.0-36.9, adult: Secondary | ICD-10-CM | POA: Diagnosis not present

## 2022-12-07 DIAGNOSIS — Z118 Encounter for screening for other infectious and parasitic diseases: Secondary | ICD-10-CM | POA: Diagnosis not present

## 2022-12-07 MED ORDER — NYSTATIN 100000 UNIT/GM EX POWD
Freq: Two times a day (BID) | CUTANEOUS | 3 refills | Status: AC
  Filled 2022-12-07: qty 30, 10d supply, fill #0
  Filled 2023-01-16 – 2023-05-03 (×2): qty 30, 10d supply, fill #1

## 2023-01-19 ENCOUNTER — Other Ambulatory Visit (HOSPITAL_COMMUNITY): Payer: Self-pay

## 2023-01-19 ENCOUNTER — Encounter (HOSPITAL_COMMUNITY): Payer: Self-pay

## 2023-01-20 ENCOUNTER — Other Ambulatory Visit: Payer: Self-pay

## 2023-05-03 ENCOUNTER — Other Ambulatory Visit (HOSPITAL_COMMUNITY): Payer: Self-pay

## 2023-05-03 DIAGNOSIS — Z30430 Encounter for insertion of intrauterine contraceptive device: Secondary | ICD-10-CM | POA: Diagnosis not present

## 2023-05-03 DIAGNOSIS — N898 Other specified noninflammatory disorders of vagina: Secondary | ICD-10-CM | POA: Diagnosis not present

## 2023-05-03 DIAGNOSIS — Z30433 Encounter for removal and reinsertion of intrauterine contraceptive device: Secondary | ICD-10-CM | POA: Diagnosis not present

## 2023-05-03 MED ORDER — TRIAMCINOLONE ACETONIDE 0.1 % EX OINT
TOPICAL_OINTMENT | CUTANEOUS | 0 refills | Status: AC
  Filled 2023-05-03: qty 30, 10d supply, fill #0

## 2023-05-05 ENCOUNTER — Other Ambulatory Visit (HOSPITAL_COMMUNITY): Payer: Self-pay

## 2023-06-02 DIAGNOSIS — Z30431 Encounter for routine checking of intrauterine contraceptive device: Secondary | ICD-10-CM | POA: Diagnosis not present

## 2023-06-22 ENCOUNTER — Encounter (HOSPITAL_COMMUNITY): Payer: Self-pay | Admitting: *Deleted

## 2023-08-03 ENCOUNTER — Other Ambulatory Visit (HOSPITAL_COMMUNITY): Payer: Self-pay

## 2023-08-03 DIAGNOSIS — L219 Seborrheic dermatitis, unspecified: Secondary | ICD-10-CM | POA: Diagnosis not present

## 2023-08-03 DIAGNOSIS — L408 Other psoriasis: Secondary | ICD-10-CM | POA: Diagnosis not present

## 2023-08-04 ENCOUNTER — Other Ambulatory Visit (HOSPITAL_COMMUNITY): Payer: Self-pay

## 2023-08-04 MED ORDER — CLOBETASOL PROPIONATE 0.05 % EX OINT
TOPICAL_OINTMENT | Freq: Two times a day (BID) | CUTANEOUS | 1 refills | Status: AC
  Filled 2023-08-04: qty 45, 30d supply, fill #0

## 2023-08-04 MED ORDER — TRIAMCINOLONE ACETONIDE 0.1 % EX CREA
TOPICAL_CREAM | CUTANEOUS | 1 refills | Status: AC
  Filled 2023-08-04: qty 440, 30d supply, fill #0

## 2023-08-07 ENCOUNTER — Other Ambulatory Visit (HOSPITAL_COMMUNITY): Payer: Self-pay

## 2023-11-09 DIAGNOSIS — H60543 Acute eczematoid otitis externa, bilateral: Secondary | ICD-10-CM | POA: Diagnosis not present

## 2023-11-09 DIAGNOSIS — Z6835 Body mass index (BMI) 35.0-35.9, adult: Secondary | ICD-10-CM | POA: Diagnosis not present

## 2023-11-09 DIAGNOSIS — F411 Generalized anxiety disorder: Secondary | ICD-10-CM | POA: Diagnosis not present

## 2023-11-09 DIAGNOSIS — Z0000 Encounter for general adult medical examination without abnormal findings: Secondary | ICD-10-CM | POA: Diagnosis not present

## 2023-11-09 DIAGNOSIS — E669 Obesity, unspecified: Secondary | ICD-10-CM | POA: Diagnosis not present

## 2023-11-09 DIAGNOSIS — Z9884 Bariatric surgery status: Secondary | ICD-10-CM | POA: Diagnosis not present

## 2023-11-09 DIAGNOSIS — B009 Herpesviral infection, unspecified: Secondary | ICD-10-CM | POA: Diagnosis not present

## 2023-11-09 DIAGNOSIS — Z13220 Encounter for screening for lipoid disorders: Secondary | ICD-10-CM | POA: Diagnosis not present

## 2023-11-23 DIAGNOSIS — H52223 Regular astigmatism, bilateral: Secondary | ICD-10-CM | POA: Diagnosis not present

## 2023-11-23 DIAGNOSIS — H5213 Myopia, bilateral: Secondary | ICD-10-CM | POA: Diagnosis not present

## 2024-06-20 ENCOUNTER — Encounter (HOSPITAL_COMMUNITY): Payer: Self-pay | Admitting: *Deleted

## 2024-10-03 DIAGNOSIS — F4323 Adjustment disorder with mixed anxiety and depressed mood: Secondary | ICD-10-CM | POA: Diagnosis not present

## 2024-10-17 DIAGNOSIS — F4323 Adjustment disorder with mixed anxiety and depressed mood: Secondary | ICD-10-CM | POA: Diagnosis not present

## 2024-10-23 DIAGNOSIS — F4323 Adjustment disorder with mixed anxiety and depressed mood: Secondary | ICD-10-CM | POA: Diagnosis not present

## 2024-10-29 DIAGNOSIS — F4323 Adjustment disorder with mixed anxiety and depressed mood: Secondary | ICD-10-CM | POA: Diagnosis not present

## 2024-11-20 ENCOUNTER — Other Ambulatory Visit (HOSPITAL_COMMUNITY): Payer: Self-pay

## 2024-11-20 DIAGNOSIS — E559 Vitamin D deficiency, unspecified: Secondary | ICD-10-CM | POA: Diagnosis not present

## 2024-11-20 DIAGNOSIS — B009 Herpesviral infection, unspecified: Secondary | ICD-10-CM | POA: Diagnosis not present

## 2024-11-20 DIAGNOSIS — H60543 Acute eczematoid otitis externa, bilateral: Secondary | ICD-10-CM | POA: Diagnosis not present

## 2024-11-20 DIAGNOSIS — Z9884 Bariatric surgery status: Secondary | ICD-10-CM | POA: Diagnosis not present

## 2024-11-20 DIAGNOSIS — Z13220 Encounter for screening for lipoid disorders: Secondary | ICD-10-CM | POA: Diagnosis not present

## 2024-11-20 MED ORDER — ESCITALOPRAM OXALATE 10 MG PO TABS
ORAL_TABLET | ORAL | 1 refills | Status: AC
  Filled 2024-12-06: qty 90, 90d supply, fill #0

## 2024-11-20 MED ORDER — CLOBETASOL PROPIONATE 0.05 % EX SOLN
1.0000 | Freq: Two times a day (BID) | CUTANEOUS | 11 refills | Status: AC
  Filled 2024-11-20: qty 50, 30d supply, fill #0
  Filled 2024-12-06: qty 50, 25d supply, fill #0

## 2024-12-02 ENCOUNTER — Other Ambulatory Visit (HOSPITAL_COMMUNITY): Payer: Self-pay

## 2024-12-06 ENCOUNTER — Other Ambulatory Visit (HOSPITAL_COMMUNITY): Payer: Self-pay

## 2024-12-06 ENCOUNTER — Other Ambulatory Visit: Payer: Self-pay

## 2024-12-06 ENCOUNTER — Encounter (HOSPITAL_COMMUNITY): Payer: Self-pay

## 2025-01-03 ENCOUNTER — Other Ambulatory Visit: Payer: Self-pay

## 2025-01-03 ENCOUNTER — Other Ambulatory Visit (HOSPITAL_COMMUNITY): Payer: Self-pay

## 2025-01-03 MED ORDER — VENLAFAXINE HCL ER 37.5 MG PO CP24
37.5000 mg | ORAL_CAPSULE | Freq: Every day | ORAL | 0 refills | Status: AC
  Filled 2025-01-03 (×3): qty 90, 90d supply, fill #0

## 2025-01-03 MED ORDER — VITAMIN D (ERGOCALCIFEROL) 1.25 MG (50000 UNIT) PO CAPS
50000.0000 [IU] | ORAL_CAPSULE | ORAL | 0 refills | Status: AC
  Filled 2025-01-03: qty 4, 28d supply, fill #0
